# Patient Record
Sex: Male | Born: 1960 | Race: White | Hispanic: No | Marital: Married | Smoking: Never smoker
Health system: Southern US, Community
[De-identification: ages and names within clinical notes are randomized; demographics above are authoritative.]

## PROBLEM LIST (undated history)

## (undated) DIAGNOSIS — F419 Anxiety disorder, unspecified: Secondary | ICD-10-CM

## (undated) DIAGNOSIS — K219 Gastro-esophageal reflux disease without esophagitis: Secondary | ICD-10-CM

## (undated) DIAGNOSIS — N189 Chronic kidney disease, unspecified: Secondary | ICD-10-CM

## (undated) DIAGNOSIS — E785 Hyperlipidemia, unspecified: Secondary | ICD-10-CM

## (undated) DIAGNOSIS — I10 Essential (primary) hypertension: Secondary | ICD-10-CM

## (undated) DIAGNOSIS — T7840XA Allergy, unspecified, initial encounter: Secondary | ICD-10-CM

## (undated) HISTORY — PX: VASECTOMY: SHX75

## (undated) HISTORY — DX: Allergy, unspecified, initial encounter: T78.40XA

## (undated) HISTORY — DX: Essential (primary) hypertension: I10

## (undated) HISTORY — PX: COLONOSCOPY: SHX174

## (undated) HISTORY — PX: WISDOM TOOTH EXTRACTION: SHX21

## (undated) HISTORY — DX: Anxiety disorder, unspecified: F41.9

## (undated) HISTORY — DX: Gastro-esophageal reflux disease without esophagitis: K21.9

## (undated) HISTORY — DX: Hyperlipidemia, unspecified: E78.5

## (undated) HISTORY — DX: Chronic kidney disease, unspecified: N18.9

---

## 2010-06-21 ENCOUNTER — Ambulatory Visit: Payer: Self-pay | Admitting: Gastroenterology

## 2010-06-23 LAB — PATHOLOGY REPORT

## 2010-09-13 ENCOUNTER — Ambulatory Visit: Payer: Self-pay | Admitting: Gastroenterology

## 2013-06-17 ENCOUNTER — Emergency Department: Payer: Self-pay | Admitting: Emergency Medicine

## 2013-06-17 LAB — CK TOTAL AND CKMB (NOT AT ARMC)
CK, Total: 85 U/L (ref 35–232)
CK-MB: 1.4 ng/mL (ref 0.5–3.6)

## 2013-06-17 LAB — COMPREHENSIVE METABOLIC PANEL
Albumin: 3.8 g/dL (ref 3.4–5.0)
Anion Gap: 6 — ABNORMAL LOW (ref 7–16)
Bilirubin,Total: 0.7 mg/dL (ref 0.2–1.0)
Calcium, Total: 9.1 mg/dL (ref 8.5–10.1)
Chloride: 96 mmol/L — ABNORMAL LOW (ref 98–107)
Co2: 32 mmol/L (ref 21–32)
EGFR (African American): >60
EGFR (Non-African Amer.): >60
Osmolality: 270 (ref 275–301)
SGOT(AST): 21 U/L (ref 15–37)
SGPT (ALT): 22 U/L (ref 12–78)
Sodium: 134 mmol/L — ABNORMAL LOW (ref 136–145)
Total Protein: 7.9 g/dL (ref 6.4–8.2)

## 2013-06-17 LAB — CBC
HCT: 49.3 % (ref 40.0–52.0)
HGB: 17.3 g/dL (ref 13.0–18.0)
MCH: 30.5 pg (ref 26.0–34.0)
MCHC: 35 g/dL (ref 32.0–36.0)
Platelet: 261 10*3/uL (ref 150–440)
RDW: 13.4 % (ref 11.5–14.5)

## 2013-06-17 LAB — TROPONIN I
Troponin-I: <0.02 ng/mL
Troponin-I: <0.02 ng/mL

## 2015-03-28 ENCOUNTER — Other Ambulatory Visit: Payer: Self-pay | Admitting: Family Medicine

## 2015-04-09 ENCOUNTER — Ambulatory Visit (INDEPENDENT_AMBULATORY_CARE_PROVIDER_SITE_OTHER): Payer: BC Managed Care – PPO | Admitting: Family Medicine

## 2015-04-09 ENCOUNTER — Encounter: Payer: Self-pay | Admitting: Family Medicine

## 2015-04-09 VITALS — BP 134/78 | HR 62 | Temp 98.6°F | Ht 68.0 in | Wt 211.0 lb

## 2015-04-09 DIAGNOSIS — M1000 Idiopathic gout, unspecified site: Secondary | ICD-10-CM | POA: Diagnosis not present

## 2015-04-09 DIAGNOSIS — R198 Other specified symptoms and signs involving the digestive system and abdomen: Secondary | ICD-10-CM

## 2015-04-09 DIAGNOSIS — K219 Gastro-esophageal reflux disease without esophagitis: Secondary | ICD-10-CM | POA: Insufficient documentation

## 2015-04-09 DIAGNOSIS — I10 Essential (primary) hypertension: Secondary | ICD-10-CM

## 2015-04-09 DIAGNOSIS — M109 Gout, unspecified: Secondary | ICD-10-CM | POA: Insufficient documentation

## 2015-04-09 DIAGNOSIS — R0989 Other specified symptoms and signs involving the circulatory and respiratory systems: Secondary | ICD-10-CM

## 2015-04-09 MED ORDER — COLCHICINE 0.6 MG PO TABS: 0.6000 mg | ORAL_TABLET | Freq: Every day | ORAL | Status: DC

## 2015-04-09 MED ORDER — LOSARTAN POTASSIUM 100 MG PO TABS: 100.0000 mg | ORAL_TABLET | Freq: Every day | ORAL | Status: DC

## 2015-04-09 MED ORDER — ALLOPURINOL 300 MG PO TABS: 300.0000 mg | ORAL_TABLET | Freq: Every day | ORAL | Status: DC

## 2015-04-09 MED ORDER — AMLODIPINE BESYLATE 10 MG PO TABS: 10.0000 mg | ORAL_TABLET | Freq: Every day | ORAL | Status: DC

## 2015-04-09 NOTE — Assessment & Plan Note (Signed)
The current medical regimen is effective;  continue present plan and medications.  

## 2015-04-09 NOTE — Progress Notes (Addendum)
   BP 134/78 mmHg  Pulse 62  Temp(Src) 98.6 F (37 C)  Ht 5\' 8"  (1.727 m)  Wt 211 lb (95.709 kg)  BMI 32.09 kg/m2  SpO2 99%   Subjective:    Patient ID: Alexander Mendoza, male    DOB: 1961/01/01, 54 y.o.   MRN: [id number]  HPI: Alexander Mendoza is a 54 y.o. male  Doing well BP no sx Gout no sx No side effects meds takes meds every day long term Reflux stable Testosterone followed by urology  Also tonsillar area will get sore and drain pus  Relevant past medical, surgical, family and social history reviewed and updated as indicated. Interim medical history since our last visit reviewed. Allergies and medications reviewed and updated.  Review of Systems  Constitutional: Negative.   Respiratory: Negative.   Cardiovascular: Negative.     Per HPI unless specifically indicated above     Objective:    BP 134/78 mmHg  Pulse 62  Temp(Src) 98.6 F (37 C)  Ht 5\' 8"  (1.727 m)  Wt 211 lb (95.709 kg)  BMI 32.09 kg/m2  SpO2 99%  Wt Readings from Last 3 Encounters:  04/09/15 211 lb (95.709 kg)  10/08/14 206 lb (93.441 kg)    Physical Exam  Constitutional: He is oriented to person, place, and time. He appears well-developed and well-nourished. No distress.  HENT:  Head: Normocephalic and atraumatic.  Right Ear: Hearing normal.  Left Ear: Hearing normal.  Nose: Nose normal.  Mouth/Throat: No tonsillar abscesses.  Able to express pus from tonsillar area  Eyes: Conjunctivae and lids are normal. Right eye exhibits no discharge. Left eye exhibits no discharge. No scleral icterus.  Cardiovascular: Normal rate, regular rhythm and normal heart sounds.   Pulmonary/Chest: Effort normal and breath sounds normal. No respiratory distress.  Musculoskeletal: Normal range of motion.  Neurological: He is alert and oriented to person, place, and time.  Skin: Skin is intact. No rash noted.  Psychiatric: He has a normal mood and affect. His speech is normal and behavior is normal. Judgment and  thought content normal. Cognition and memory are normal.        Assessment & Plan:   Problem List Items Addressed This Visit      Cardiovascular and Mediastinum   Hypertension - Primary    The current medical regimen is effective;  continue present plan and medications.       Relevant Medications   amLODipine (NORVASC) 10 MG tablet   losartan (COZAAR) 100 MG tablet   Other Relevant Orders   Basic metabolic panel     Digestive   GERD (gastroesophageal reflux disease)    Followed by GI and stable      Relevant Medications   dexlansoprazole (DEXILANT) 60 MG capsule     Other   Gout    The current medical regimen is effective;  continue present plan and medications.       Relevant Medications   allopurinol (ZYLOPRIM) 300 MG tablet   colchicine 0.6 MG tablet    Other Visit Diagnoses    Tonsil symptom        pus drainage     Relevant Orders    Ambulatory referral to ENT        Follow up plan: Return in about 6 months (around 10/09/2015), or if symptoms worsen or fail to improve, for Physical Exam.

## 2015-04-09 NOTE — Addendum Note (Signed)
Addended byGolden Pop on: 04/09/2015 09:41 AM   Modules accepted: Orders

## 2015-04-09 NOTE — Assessment & Plan Note (Signed)
Followed by GI and stable 

## 2015-04-10 LAB — BASIC METABOLIC PANEL
BUN/Creatinine Ratio: 12 (ref 9–20)
BUN: 13 mg/dL (ref 6–24)
CO2: 24 mmol/L (ref 18–29)
CREATININE: 1.08 mg/dL (ref 0.76–1.27)
Calcium: 9.1 mg/dL (ref 8.7–10.2)
Chloride: 101 mmol/L (ref 97–108)
GFR calc Af Amer: 88 mL/min/{1.73_m2} (ref >59)
GFR calc non Af Amer: 76 mL/min/{1.73_m2} (ref >59)
GLUCOSE: 102 mg/dL — AB (ref 65–99)
POTASSIUM: 4.6 mmol/L (ref 3.5–5.2)
Sodium: 142 mmol/L (ref 134–144)

## 2015-10-13 ENCOUNTER — Encounter: Payer: Self-pay | Admitting: Family Medicine

## 2015-10-13 ENCOUNTER — Ambulatory Visit (INDEPENDENT_AMBULATORY_CARE_PROVIDER_SITE_OTHER): Payer: BC Managed Care – PPO | Admitting: Family Medicine

## 2015-10-13 VITALS — BP 132/84 | HR 61 | Temp 98.9°F | Ht 67.7 in | Wt 215.0 lb

## 2015-10-13 DIAGNOSIS — J019 Acute sinusitis, unspecified: Secondary | ICD-10-CM | POA: Diagnosis not present

## 2015-10-13 DIAGNOSIS — K219 Gastro-esophageal reflux disease without esophagitis: Secondary | ICD-10-CM | POA: Diagnosis not present

## 2015-10-13 DIAGNOSIS — I10 Essential (primary) hypertension: Secondary | ICD-10-CM

## 2015-10-13 DIAGNOSIS — M1000 Idiopathic gout, unspecified site: Secondary | ICD-10-CM

## 2015-10-13 DIAGNOSIS — Z0000 Encounter for general adult medical examination without abnormal findings: Secondary | ICD-10-CM

## 2015-10-13 LAB — URINALYSIS, ROUTINE W REFLEX MICROSCOPIC
BILIRUBIN UA: NEGATIVE
Glucose, UA: NEGATIVE
Ketones, UA: NEGATIVE
Leukocytes, UA: NEGATIVE
Nitrite, UA: NEGATIVE
PH UA: 5 (ref 5.0–7.5)
Protein, UA: NEGATIVE
RBC, UA: NEGATIVE
Specific Gravity, UA: 1.03 (ref 1.005–1.030)
Urobilinogen, Ur: 0.2 mg/dL (ref 0.2–1.0)

## 2015-10-13 MED ORDER — AMOXICILLIN 875 MG PO TABS: 875.0000 mg | ORAL_TABLET | Freq: Two times a day (BID) | ORAL | Status: DC

## 2015-10-13 MED ORDER — ALLOPURINOL 300 MG PO TABS: 300.0000 mg | ORAL_TABLET | Freq: Every day | ORAL | Status: DC

## 2015-10-13 MED ORDER — AZELASTINE HCL 0.1 % NA SOLN: 1.0000 | Freq: Two times a day (BID) | NASAL | Status: DC

## 2015-10-13 MED ORDER — AMLODIPINE BESYLATE 10 MG PO TABS: 10.0000 mg | ORAL_TABLET | Freq: Every day | ORAL | Status: DC

## 2015-10-13 MED ORDER — LOSARTAN POTASSIUM 100 MG PO TABS: 100.0000 mg | ORAL_TABLET | Freq: Every day | ORAL | Status: DC

## 2015-10-13 MED ORDER — FLUTICASONE PROPIONATE 50 MCG/ACT NA SUSP: 2.0000 | Freq: Every day | NASAL | Status: DC

## 2015-10-13 MED ORDER — BENZONATATE 100 MG PO CAPS: 100.0000 mg | ORAL_CAPSULE | Freq: Two times a day (BID) | ORAL | Status: DC | PRN

## 2015-10-13 NOTE — Assessment & Plan Note (Signed)
The current medical regimen is effective;  continue present plan and medications.  

## 2015-10-13 NOTE — Progress Notes (Signed)
BP 132/84 mmHg  Pulse 61  Temp(Src) 98.9 F (37.2 C)  Ht 5' 7.7" (1.72 m)  Wt 215 lb (97.523 kg)  BMI 32.96 kg/m2  SpO2 97%   Subjective:    Patient ID: Alexander Mendoza, male    DOB: 07-28-1961, 55 y.o.   MRN: [id number]  HPI: Alexander Mendoza is a 55 y.o. male  Chief Complaint  Patient presents with  . Annual Exam   Agent for physical but also has had some coughing going on started 4 days ago and is coughing worse now for chills does have head congestion drainage and decreased hearing with ear tightness  No gout symptoms taking allopurinol without problems Getting testosterone replacement therapy through urology Reflux stable  blood pressure stable   Relevant past medical, surgical, family and social history reviewed and updated as indicated. Interim medical history since our last visit reviewed. Allergies and medications reviewed and updated.   Other than noted above Review of Systems  Constitutional: Negative.   HENT: Negative.   Eyes: Negative.   Respiratory: Negative.   Cardiovascular: Negative.   Gastrointestinal: Negative.   Endocrine: Negative.   Genitourinary: Negative.   Musculoskeletal: Negative.   Skin: Negative.   Allergic/Immunologic: Negative.   Neurological: Negative.   Hematological: Negative.   Psychiatric/Behavioral: Negative.     Per HPI unless specifically indicated above     Objective:    BP 132/84 mmHg  Pulse 61  Temp(Src) 98.9 F (37.2 C)  Ht 5' 7.7" (1.72 m)  Wt 215 lb (97.523 kg)  BMI 32.96 kg/m2  SpO2 97%  Wt Readings from Last 3 Encounters:  10/13/15 215 lb (97.523 kg)  04/09/15 211 lb (95.709 kg)  10/08/14 206 lb (93.441 kg)    Physical Exam  Constitutional: He is oriented to person, place, and time. He appears well-developed and well-nourished.  HENT:  Head: Normocephalic and atraumatic.  Right Ear: External ear normal.  Left Ear: External ear normal.  Mouth/Throat: Oropharyngeal exudate present.  Eyes:  Conjunctivae and EOM are normal. Pupils are equal, round, and reactive to light.  Neck: Normal range of motion. Neck supple.  Cardiovascular: Normal rate, regular rhythm, normal heart sounds and intact distal pulses.   Pulmonary/Chest: Effort normal and breath sounds normal.  Abdominal: Soft. Bowel sounds are normal. There is no splenomegaly or hepatomegaly.  Genitourinary:  Done at urology  Musculoskeletal: Normal range of motion.  Neurological: He is alert and oriented to person, place, and time. He has normal reflexes.  Skin: No rash noted. No erythema.  Psychiatric: He has a normal mood and affect. His behavior is normal. Judgment and thought content normal.    Results for orders placed or performed in visit on 123456  Basic metabolic panel  Result Value Ref Range   Glucose 102 (H) 65 - 99 mg/dL   BUN 13 6 - 24 mg/dL   Creatinine, Ser 1.08 0.76 - 1.27 mg/dL   GFR calc non Af Amer 76 >59 mL/min/1.73   GFR calc Af Amer 88 >59 mL/min/1.73   BUN/Creatinine Ratio 12 9 - 20   Sodium 142 134 - 144 mmol/L   Potassium 4.6 3.5 - 5.2 mmol/L   Chloride 101 97 - 108 mmol/L   CO2 24 18 - 29 mmol/L   Calcium 9.1 8.7 - 10.2 mg/dL      Assessment & Plan:   Problem List Items Addressed This Visit      Cardiovascular and Mediastinum   Hypertension    The  current medical regimen is effective;  continue present plan and medications.       Relevant Medications   amLODipine (NORVASC) 10 MG tablet   losartan (COZAAR) 100 MG tablet     Respiratory   Sinusitis, acute    Discuss sinusitis care and treatment Tylenol Mucinex Mucinex D nasal rinse Discuss use of Amoxil Tessalon Perles for cough        Relevant Medications   fluticasone (FLONASE) 50 MCG/ACT nasal spray   azelastine (ASTELIN) 0.1 % nasal spray   amoxicillin (AMOXIL) 875 MG tablet   benzonatate (TESSALON) 100 MG capsule     Digestive   GERD (gastroesophageal reflux disease)    The current medical regimen is  effective;  continue present plan and medications.         Other   Gout    The current medical regimen is effective;  continue present plan and medications.       Relevant Medications   allopurinol (ZYLOPRIM) 300 MG tablet   Other Relevant Orders   Uric acid    Other Visit Diagnoses    Routine general medical examination at a health care facility    -  Primary    Relevant Orders    CBC with Differential/Platelet    Comprehensive metabolic panel    Lipid Panel w/o Chol/HDL Ratio    PSA    TSH    Urinalysis, Routine w reflex microscopic (not at [facility])        Follow up plan: Return in about 6 months (around 04/11/2016) for BMP.

## 2015-10-13 NOTE — Assessment & Plan Note (Signed)
Discuss sinusitis care and treatment Tylenol Mucinex Mucinex D nasal rinse Discuss use of Amoxil Tessalon Perles for cough

## 2015-10-14 ENCOUNTER — Encounter: Payer: Self-pay | Admitting: Family Medicine

## 2015-10-14 LAB — COMPREHENSIVE METABOLIC PANEL
ALBUMIN: 4 g/dL (ref 3.5–5.5)
ALT: 38 IU/L (ref 0–44)
AST: 36 IU/L (ref 0–40)
Albumin/Globulin Ratio: 1.6 (ref 1.1–2.5)
Alkaline Phosphatase: 97 IU/L (ref 39–117)
BILIRUBIN TOTAL: 0.4 mg/dL (ref 0.0–1.2)
BUN/Creatinine Ratio: 10 (ref 9–20)
BUN: 11 mg/dL (ref 6–24)
CO2: 24 mmol/L (ref 18–29)
CREATININE: 1.15 mg/dL (ref 0.76–1.27)
Calcium: 8.5 mg/dL — ABNORMAL LOW (ref 8.7–10.2)
Chloride: 100 mmol/L (ref 96–106)
GFR calc Af Amer: 81 mL/min/{1.73_m2} (ref >59)
GFR calc non Af Amer: 70 mL/min/{1.73_m2} (ref >59)
GLOBULIN, TOTAL: 2.5 g/dL (ref 1.5–4.5)
GLUCOSE: 86 mg/dL (ref 65–99)
Potassium: 4.2 mmol/L (ref 3.5–5.2)
Sodium: 141 mmol/L (ref 134–144)
Total Protein: 6.5 g/dL (ref 6.0–8.5)

## 2015-10-14 LAB — CBC WITH DIFFERENTIAL/PLATELET
BASOS ABS: 0 10*3/uL (ref 0.0–0.2)
Basos: 0 %
EOS (ABSOLUTE): 0.4 10*3/uL (ref 0.0–0.4)
Eos: 5 %
Hematocrit: 44.2 % (ref 37.5–51.0)
Hemoglobin: 15.5 g/dL (ref 12.6–17.7)
Immature Grans (Abs): 0 10*3/uL (ref 0.0–0.1)
Immature Granulocytes: 0 %
LYMPHS ABS: 1.8 10*3/uL (ref 0.7–3.1)
Lymphs: 19 %
MCH: 30.2 pg (ref 26.6–33.0)
MCHC: 35.1 g/dL (ref 31.5–35.7)
MCV: 86 fL (ref 79–97)
Monocytes Absolute: 1 10*3/uL — ABNORMAL HIGH (ref 0.1–0.9)
Monocytes: 10 %
NEUTROS ABS: 6.4 10*3/uL (ref 1.4–7.0)
Neutrophils: 66 %
Platelets: 282 10*3/uL (ref 150–379)
RBC: 5.14 x10E6/uL (ref 4.14–5.80)
RDW: 14 % (ref 12.3–15.4)
WBC: 9.8 10*3/uL (ref 3.4–10.8)

## 2015-10-14 LAB — LIPID PANEL W/O CHOL/HDL RATIO
CHOLESTEROL TOTAL: 206 mg/dL — AB (ref 100–199)
HDL: 53 mg/dL (ref >39)
LDL Calculated: 110 mg/dL — ABNORMAL HIGH (ref 0–99)
TRIGLYCERIDES: 216 mg/dL — AB (ref 0–149)
VLDL Cholesterol Cal: 43 mg/dL — ABNORMAL HIGH (ref 5–40)

## 2015-10-14 LAB — TSH: TSH: 2.87 u[IU]/mL (ref 0.450–4.500)

## 2015-10-14 LAB — PSA: PROSTATE SPECIFIC AG, SERUM: 3.8 ng/mL (ref 0.0–4.0)

## 2015-10-15 ENCOUNTER — Telehealth: Payer: Self-pay | Admitting: Family Medicine

## 2015-10-15 NOTE — Telephone Encounter (Signed)
Phone call Discussed with patient labs elevated PSA patient has appointment with Dr. Eliberto Ivory in March for follow-up with PSA with urology.

## 2015-10-15 NOTE — Telephone Encounter (Signed)
Pt called stated he is returning Nancy's call, pt stated he will only have his phone on him for the next 20 minutes please call back ASAP. Pt stated it is ok to leave detailed message on cell #. Thanks.

## 2015-10-15 NOTE — Telephone Encounter (Signed)
-----   Message from Wynn Maudlin, Pisek sent at 10/15/2015 12:05 PM EST ----- labs

## 2015-11-19 ENCOUNTER — Encounter: Payer: Self-pay | Admitting: Family Medicine

## 2015-11-19 ENCOUNTER — Ambulatory Visit (INDEPENDENT_AMBULATORY_CARE_PROVIDER_SITE_OTHER): Payer: BC Managed Care – PPO | Admitting: Family Medicine

## 2015-11-19 VITALS — BP 127/84 | HR 84 | Temp 98.7°F | Ht 67.2 in | Wt 215.0 lb

## 2015-11-19 DIAGNOSIS — R52 Pain, unspecified: Secondary | ICD-10-CM

## 2015-11-19 DIAGNOSIS — J101 Influenza due to other identified influenza virus with other respiratory manifestations: Secondary | ICD-10-CM | POA: Diagnosis not present

## 2015-11-19 DIAGNOSIS — R509 Fever, unspecified: Secondary | ICD-10-CM

## 2015-11-19 LAB — INFLUENZA A AND B
INFLUENZA B AG, EIA: POSITIVE — AB
Influenza A Ag, EIA: NEGATIVE

## 2015-11-19 MED ORDER — BENZONATATE 200 MG PO CAPS: 200.0000 mg | ORAL_CAPSULE | Freq: Three times a day (TID) | ORAL | Status: DC | PRN

## 2015-11-19 MED ORDER — HYDROCOD POLST-CPM POLST ER 10-8 MG/5ML PO SUER: 5.0000 mL | Freq: Every evening | ORAL | Status: DC | PRN

## 2015-11-19 NOTE — Progress Notes (Signed)
BP 127/84 mmHg  Pulse 84  Temp(Src) 98.7 F (37.1 C)  Ht 5' 7.2" (1.707 m)  Wt 215 lb (97.523 kg)  BMI 33.47 kg/m2  SpO2 95%   Subjective:    Patient ID: Alexander Mendoza, male    DOB: December 26, 1960, 55 y.o.   MRN: [id number]  HPI: Alexander Mendoza is a 55 y.o. male  Chief Complaint  Patient presents with  . URI    X 4 days   UPPER RESPIRATORY TRACT INFECTION Duration: 4 days Worst symptom: Chills body aches Fever: yes Cough: yes Shortness of breath: yes Wheezing: yes Chest pain: no Chest tightness: no Chest congestion: no Nasal congestion: no Runny nose: yes Post nasal drip: yes Sneezing: no Sore throat: yes Swollen glands: no Sinus pressure: no Headache: yes Face pain: yes Toothache: yes Ear pain: yes bilateral Ear pressure:  yes Eyes red/itching:no Eye drainage/crusting: no  Vomiting: no Rash: no Fatigue: yes Sick contacts: yes Strep contacts: yes  Context: stable Recurrent sinusitis: no Relief with OTC cold/cough medications: no  Treatments attempted: cold/sinus, mucinex, anti-histamine and pseudoephedrine    Relevant past medical, surgical, family and social history reviewed and updated as indicated. Interim medical history since our last visit reviewed. Allergies and medications reviewed and updated.  Review of Systems  Constitutional: Negative.   HENT: Negative.   Respiratory: Negative.   Cardiovascular: Negative.   Gastrointestinal: Negative.   Psychiatric/Behavioral: Negative.     Per HPI unless specifically indicated above     Objective:    BP 127/84 mmHg  Pulse 84  Temp(Src) 98.7 F (37.1 C)  Ht 5' 7.2" (1.707 m)  Wt 215 lb (97.523 kg)  BMI 33.47 kg/m2  SpO2 95%  Wt Readings from Last 3 Encounters:  11/19/15 215 lb (97.523 kg)  10/13/15 215 lb (97.523 kg)  04/09/15 211 lb (95.709 kg)    Physical Exam  Constitutional: He is oriented to person, place, and time. He appears well-developed and well-nourished. No distress.  HENT:   Head: Normocephalic and atraumatic.  Right Ear: Hearing and external ear normal.  Left Ear: Hearing and external ear normal.  Nose: Nose normal.  Mouth/Throat: Oropharynx is clear and moist. No oropharyngeal exudate.  Eyes: Conjunctivae, EOM and lids are normal. Pupils are equal, round, and reactive to light. Right eye exhibits no discharge. Left eye exhibits no discharge. No scleral icterus.  Neck: Normal range of motion. Neck supple.  Cardiovascular: Normal rate, regular rhythm, normal heart sounds and intact distal pulses.  Exam reveals no gallop and no friction rub.   No murmur heard. Pulmonary/Chest: Effort normal and breath sounds normal. No respiratory distress. He has no wheezes. He has no rales. He exhibits no tenderness.  Musculoskeletal: Normal range of motion.  Neurological: He is alert and oriented to person, place, and time.  Skin: Skin is warm, dry and intact. No rash noted. No erythema. No pallor.  Psychiatric: He has a normal mood and affect. His speech is normal and behavior is normal. Judgment and thought content normal. Cognition and memory are normal.  Nursing note and vitals reviewed.   Results for orders placed or performed in visit on 10/13/15  CBC with Differential/Platelet  Result Value Ref Range   WBC 9.8 3.4 - 10.8 x10E3/uL   RBC 5.14 4.14 - 5.80 x10E6/uL   Hemoglobin 15.5 12.6 - 17.7 g/dL   Hematocrit 44.2 37.5 - 51.0 %   MCV 86 79 - 97 fL   MCH 30.2 26.6 - 33.0 pg  MCHC 35.1 31.5 - 35.7 g/dL   RDW 14.0 12.3 - 15.4 %   Platelets 282 150 - 379 x10E3/uL   Neutrophils 66 %   Lymphs 19 %   Monocytes 10 %   Eos 5 %   Basos 0 %   Neutrophils Absolute 6.4 1.4 - 7.0 x10E3/uL   Lymphocytes Absolute 1.8 0.7 - 3.1 x10E3/uL   Monocytes Absolute 1.0 (H) 0.1 - 0.9 x10E3/uL   EOS (ABSOLUTE) 0.4 0.0 - 0.4 x10E3/uL   Basophils Absolute 0.0 0.0 - 0.2 x10E3/uL   Immature Granulocytes 0 %   Immature Grans (Abs) 0.0 0.0 - 0.1 x10E3/uL  Comprehensive metabolic panel   Result Value Ref Range   Glucose 86 65 - 99 mg/dL   BUN 11 6 - 24 mg/dL   Creatinine, Ser 1.15 0.76 - 1.27 mg/dL   GFR calc non Af Amer 70 >59 mL/min/1.73   GFR calc Af Amer 81 >59 mL/min/1.73   BUN/Creatinine Ratio 10 9 - 20   Sodium 141 134 - 144 mmol/L   Potassium 4.2 3.5 - 5.2 mmol/L   Chloride 100 96 - 106 mmol/L   CO2 24 18 - 29 mmol/L   Calcium 8.5 (L) 8.7 - 10.2 mg/dL   Total Protein 6.5 6.0 - 8.5 g/dL   Albumin 4.0 3.5 - 5.5 g/dL   Globulin, Total 2.5 1.5 - 4.5 g/dL   Albumin/Globulin Ratio 1.6 1.1 - 2.5   Bilirubin Total 0.4 0.0 - 1.2 mg/dL   Alkaline Phosphatase 97 39 - 117 IU/L   AST 36 0 - 40 IU/L   ALT 38 0 - 44 IU/L  Lipid Panel w/o Chol/HDL Ratio  Result Value Ref Range   Cholesterol, Total 206 (H) 100 - 199 mg/dL   Triglycerides 216 (H) 0 - 149 mg/dL   HDL 53 >39 mg/dL   VLDL Cholesterol Cal 43 (H) 5 - 40 mg/dL   LDL Calculated 110 (H) 0 - 99 mg/dL  PSA  Result Value Ref Range   Prostate Specific Ag, Serum 3.8 0.0 - 4.0 ng/mL  TSH  Result Value Ref Range   TSH 2.870 0.450 - 4.500 uIU/mL  Urinalysis, Routine w reflex microscopic (not at [facility])  Result Value Ref Range   Specific Gravity, UA 1.030 1.005 - 1.030   pH, UA 5.0 5.0 - 7.5   Color, UA Yellow Yellow   Appearance Ur Clear Clear   Leukocytes, UA Negative Negative   Protein, UA Negative Negative/Trace   Glucose, UA Negative Negative   Ketones, UA Negative Negative   RBC, UA Negative Negative   Bilirubin, UA Negative Negative   Urobilinogen, Ur 0.2 0.2 - 1.0 mg/dL   Nitrite, UA Negative Negative      Assessment & Plan:   Problem List Items Addressed This Visit    None    Visit Diagnoses    Influenza B    -  Primary    Too late for tamiflu. Will treat symptoms with tussionex and tessalon perles. Continue to monitor.     Body aches        Will swab for flu. Await results.     Relevant Orders    Influenza a and b    Other specified fever        Will swab for flu. Await results.      Relevant Orders    Influenza a and b        Follow up plan: Return if symptoms worsen or fail to  improve.

## 2016-05-09 ENCOUNTER — Ambulatory Visit (INDEPENDENT_AMBULATORY_CARE_PROVIDER_SITE_OTHER): Payer: BC Managed Care – PPO | Admitting: Family Medicine

## 2016-05-09 ENCOUNTER — Encounter: Payer: Self-pay | Admitting: Family Medicine

## 2016-05-09 VITALS — BP 139/82 | HR 57 | Temp 98.0°F | Ht 68.3 in | Wt 215.0 lb

## 2016-05-09 DIAGNOSIS — I10 Essential (primary) hypertension: Secondary | ICD-10-CM

## 2016-05-09 NOTE — Progress Notes (Signed)
   BP 139/82 (BP Location: Left Arm, Patient Position: Sitting, Cuff Size: Normal)   Pulse (!) 57   Temp 98 F (36.7 C)   Ht 5' 8.3" (1.735 m)   Wt 215 lb (97.5 kg)   SpO2 99%   BMI 32.40 kg/m    Subjective:    Patient ID: Alexander Mendoza, male    DOB: [birth year], 55 y.o.   MRN: [id number]  HPI: Alexander Mendoza is a 55 y.o. male  Chief Complaint  Patient presents with  . Hypertension  Patient doing well no complaints taking blood pressure medicines without problems or issues good control checks occasionally from time to time with good control. Reflux well controlled on Dexalant which is only thing that seems to work. No episodes of gout doing well no complaints from medications  Relevant past medical, surgical, family and social history reviewed and updated as indicated. Interim medical history since our last visit reviewed. Allergies and medications reviewed and updated.  Review of Systems  Constitutional: Negative.   Respiratory: Negative.   Cardiovascular: Negative.     Per HPI unless specifically indicated above     Objective:    BP 139/82 (BP Location: Left Arm, Patient Position: Sitting, Cuff Size: Normal)   Pulse (!) 57   Temp 98 F (36.7 C)   Ht 5' 8.3" (1.735 m)   Wt 215 lb (97.5 kg)   SpO2 99%   BMI 32.40 kg/m   Wt Readings from Last 3 Encounters:  05/09/16 215 lb (97.5 kg)  11/19/15 215 lb (97.5 kg)  10/13/15 215 lb (97.5 kg)    Physical Exam  Constitutional: He is oriented to person, place, and time. He appears well-developed and well-nourished. No distress.  HENT:  Head: Normocephalic and atraumatic.  Right Ear: Hearing normal.  Left Ear: Hearing normal.  Nose: Nose normal.  Eyes: Conjunctivae and lids are normal. Right eye exhibits no discharge. Left eye exhibits no discharge. No scleral icterus.  Cardiovascular: Normal rate, regular rhythm and normal heart sounds.   Pulmonary/Chest: Effort normal and breath sounds normal. No respiratory  distress.  Musculoskeletal: Normal range of motion.  Neurological: He is alert and oriented to person, place, and time.  Skin: Skin is intact. No rash noted.  Psychiatric: He has a normal mood and affect. His speech is normal and behavior is normal. Judgment and thought content normal. Cognition and memory are normal.    Results for orders placed or performed in visit on 11/19/15  Influenza a and b  Result Value Ref Range   Influenza A Ag, EIA Negative Negative   Influenza B Ag, EIA Positive (A) Negative   Influenza Comment None       Assessment & Plan:   Problem List Items Addressed This Visit      Cardiovascular and Mediastinum   Hypertension - Primary   Relevant Medications   Multiple Vitamin (MULTI-VITAMINS) TABS   Other Relevant Orders   Basic metabolic panel    Other Visit Diagnoses   None.      Follow up plan: Return in about 6 months (around 11/09/2016) for  Physical exam labs and uric acid.

## 2016-05-10 ENCOUNTER — Encounter: Payer: Self-pay | Admitting: Family Medicine

## 2016-05-10 LAB — BASIC METABOLIC PANEL
BUN / CREAT RATIO: 10 (ref 9–20)
BUN: 12 mg/dL (ref 6–24)
CO2: 28 mmol/L (ref 18–29)
Calcium: 9 mg/dL (ref 8.7–10.2)
Chloride: 99 mmol/L (ref 96–106)
Creatinine, Ser: 1.25 mg/dL (ref 0.76–1.27)
GFR calc Af Amer: 73 mL/min/{1.73_m2} (ref >59)
GFR, EST NON AFRICAN AMERICAN: 64 mL/min/{1.73_m2} (ref >59)
GLUCOSE: 101 mg/dL — AB (ref 65–99)
POTASSIUM: 4 mmol/L (ref 3.5–5.2)
SODIUM: 141 mmol/L (ref 134–144)

## 2016-08-19 ENCOUNTER — Encounter: Payer: Self-pay | Admitting: Family Medicine

## 2016-08-19 ENCOUNTER — Ambulatory Visit (INDEPENDENT_AMBULATORY_CARE_PROVIDER_SITE_OTHER): Payer: BC Managed Care – PPO | Admitting: Family Medicine

## 2016-08-19 VITALS — BP 136/87 | HR 73 | Temp 98.5°F | Wt 220.0 lb

## 2016-08-19 DIAGNOSIS — J069 Acute upper respiratory infection, unspecified: Secondary | ICD-10-CM | POA: Diagnosis not present

## 2016-08-19 MED ORDER — AMOXICILLIN-POT CLAVULANATE 875-125 MG PO TABS: 1.0000 | ORAL_TABLET | Freq: Two times a day (BID) | ORAL | 0 refills | Status: DC

## 2016-08-19 MED ORDER — BENZONATATE 100 MG PO CAPS: 200.0000 mg | ORAL_CAPSULE | Freq: Three times a day (TID) | ORAL | 0 refills | Status: DC | PRN

## 2016-08-19 NOTE — Patient Instructions (Signed)
Follow up as needed

## 2016-08-19 NOTE — Progress Notes (Signed)
   BP 136/87   Pulse 73   Temp 98.5 F (36.9 C)   Wt 220 lb (99.8 kg)   SpO2 98%   BMI 33.16 kg/m    Subjective:    Patient ID: Alexander Mendoza, male    DOB: 06-26-1961, 55 y.o.   MRN: [id number]  HPI: Alexander Mendoza is a 55 y.o. male  Chief Complaint  Patient presents with  . URI    x 1 week. head and chest congestion, productive cough, ears clogged. No fever, no sore throat.    Patient presents with 1 week history of congestion, productive cough, ear pain, and malaise. Denies fever, chills, N/V/D. Taking Mucinex with minimal relief. Wife has been sick recently as well.   Relevant past medical, surgical, family and social history reviewed and updated as indicated. Interim medical history since our last visit reviewed. Allergies and medications reviewed and updated.  Review of Systems  Constitutional: Positive for fatigue.  HENT: Positive for congestion, ear pain, postnasal drip, rhinorrhea and sinus pressure.   Eyes: Positive for discharge.  Respiratory: Positive for cough.   Cardiovascular: Negative.   Gastrointestinal: Negative.   Genitourinary: Negative.   Musculoskeletal: Negative.   Skin: Negative.   Neurological: Negative.   Psychiatric/Behavioral: Negative.     Per HPI unless specifically indicated above     Objective:    BP 136/87   Pulse 73   Temp 98.5 F (36.9 C)   Wt 220 lb (99.8 kg)   SpO2 98%   BMI 33.16 kg/m   Wt Readings from Last 3 Encounters:  08/19/16 220 lb (99.8 kg)  05/09/16 215 lb (97.5 kg)  11/19/15 215 lb (97.5 kg)    Physical Exam  Constitutional: He is oriented to person, place, and time. He appears well-developed and well-nourished.  HENT:  Head: Atraumatic.  Nose: Nose normal.  Left TM mildly injected Oropharynx erythematous  Eyes: Conjunctivae are normal. Pupils are equal, round, and reactive to light. No scleral icterus.  Neck: Normal range of motion. Neck supple.  Cardiovascular: Normal rate.   Pulmonary/Chest: Effort  normal and breath sounds normal. No respiratory distress. He has no wheezes. He has no rales.  Musculoskeletal: Normal range of motion. He exhibits no tenderness.  Neurological: He is alert and oriented to person, place, and time.  Skin: Skin is warm and dry. No rash noted.  Psychiatric: He has a normal mood and affect. His behavior is normal.  Nursing note and vitals reviewed.     Assessment & Plan:   Problem List Items Addressed This Visit    None    Visit Diagnoses    Upper respiratory tract infection, unspecified type    -  Primary   Augmentin and tessalon perles sent. Continue mucinex, can try delsym or nyquil for nighttime cough relief. Rest, good hydration.        Follow up plan: Return if symptoms worsen or fail to improve.

## 2016-10-13 ENCOUNTER — Ambulatory Visit (INDEPENDENT_AMBULATORY_CARE_PROVIDER_SITE_OTHER): Payer: BC Managed Care – PPO | Admitting: Family Medicine

## 2016-10-13 ENCOUNTER — Other Ambulatory Visit: Payer: Self-pay

## 2016-10-13 ENCOUNTER — Encounter: Payer: Self-pay | Admitting: Family Medicine

## 2016-10-13 VITALS — BP 129/83 | HR 75 | Temp 98.8°F | Wt 222.0 lb

## 2016-10-13 DIAGNOSIS — B029 Zoster without complications: Secondary | ICD-10-CM

## 2016-10-13 MED ORDER — VALACYCLOVIR HCL 1 G PO TABS: 1000.0000 mg | ORAL_TABLET | Freq: Three times a day (TID) | ORAL | 0 refills | Status: DC

## 2016-10-13 MED ORDER — PREDNISONE 20 MG PO TABS: 40.0000 mg | ORAL_TABLET | Freq: Every day | ORAL | 0 refills | Status: DC

## 2016-10-13 NOTE — Patient Instructions (Signed)
Follow up as needed

## 2016-10-13 NOTE — Progress Notes (Signed)
   BP 129/83   Pulse 75   Temp 98.8 F (37.1 C)   Wt 222 lb (100.7 kg)   SpO2 97%   BMI 33.46 kg/m    Subjective:    Patient ID: Alexander Mendoza, male    DOB: [birth year], 56 y.o.   MRN: [id number]  HPI: Alexander Mendoza is a 56 y.o. male  Chief Complaint  Patient presents with  . Rash    x 4 days. small rash on his upper right arm , burning on right side of his chest/abdomen.   4 day history of tingling, burning area on right chest wrapping around side. Some red areas have come up but not many. Very painful to the touch. Denies hx of shingles or vaccine. No new products or foods. Has not tried anything OTC.   Relevant past medical, surgical, family and social history reviewed and updated as indicated. Interim medical history since our last visit reviewed. Allergies and medications reviewed and updated.  Review of Systems  Constitutional: Negative.   HENT: Negative.   Eyes: Negative.   Respiratory: Positive for choking.   Cardiovascular: Negative.   Gastrointestinal: Negative.   Genitourinary: Negative.   Musculoskeletal: Negative.   Skin: Negative.   Neurological: Negative.   Psychiatric/Behavioral: Negative.     Per HPI unless specifically indicated above     Objective:    BP 129/83   Pulse 75   Temp 98.8 F (37.1 C)   Wt 222 lb (100.7 kg)   SpO2 97%   BMI 33.46 kg/m   Wt Readings from Last 3 Encounters:  10/13/16 222 lb (100.7 kg)  08/19/16 220 lb (99.8 kg)  05/09/16 215 lb (97.5 kg)    Physical Exam  Constitutional: He is oriented to person, place, and time. He appears well-developed and well-nourished. No distress.  HENT:  Head: Atraumatic.  Eyes: Conjunctivae are normal. Pupils are equal, round, and reactive to light.  Neck: Normal range of motion. Neck supple.  Cardiovascular: Normal rate and normal heart sounds.   Pulmonary/Chest: Effort normal and breath sounds normal. No respiratory distress.  Musculoskeletal: Normal range of motion.    Neurological: He is alert and oriented to person, place, and time.  Skin: Skin is warm and dry. Rash (Clustered erythematous maculopapular rash on right upper side, very slight) noted.  Psychiatric: He has a normal mood and affect. His behavior is normal.  Nursing note and vitals reviewed.     Assessment & Plan:   Problem List Items Addressed This Visit    None    Visit Diagnoses    Herpes zoster without complication    -  Primary   Will treat with valtrex and prednisone burst. Discussed keeping covered, good hand hygeine, reducing contact wiht immune compromised people   Relevant Medications   valACYclovir (VALTREX) 1000 MG tablet       Follow up plan: Return if symptoms worsen or fail to improve.

## 2016-10-17 ENCOUNTER — Telehealth: Payer: Self-pay | Admitting: Family Medicine

## 2016-10-17 NOTE — Telephone Encounter (Signed)
Patient was seen. 

## 2016-10-18 NOTE — Telephone Encounter (Signed)
We don't call in antibiotics without being seen. Sorry.

## 2016-10-18 NOTE — Telephone Encounter (Signed)
Patient was seen by Alexander Mendoza on 1/4 for possible shingles but since he was seen here he has developed a  possible sinus infection.  He is hoping to avoid coming into the office since he was just seen here on the 4th.  He is hoping someone will call him in something for this.  Please advise. Thank you [name]  He said if we cannot reach him we can leave a message.

## 2016-10-18 NOTE — Telephone Encounter (Signed)
Routing to provider  

## 2016-10-18 NOTE — Telephone Encounter (Signed)
Called and left patient a VM (it was personalized) letting him know what Dr. Wynetta Emery said. I asked for the patient to give [place] a call to schedule an appointment.

## 2016-10-19 ENCOUNTER — Ambulatory Visit (INDEPENDENT_AMBULATORY_CARE_PROVIDER_SITE_OTHER): Payer: BC Managed Care – PPO | Admitting: Family Medicine

## 2016-10-19 ENCOUNTER — Other Ambulatory Visit: Payer: Self-pay | Admitting: Family Medicine

## 2016-10-19 ENCOUNTER — Encounter: Payer: Self-pay | Admitting: Family Medicine

## 2016-10-19 VITALS — BP 146/88 | HR 70 | Temp 98.4°F | Wt 220.0 lb

## 2016-10-19 DIAGNOSIS — I10 Essential (primary) hypertension: Secondary | ICD-10-CM

## 2016-10-19 DIAGNOSIS — J101 Influenza due to other identified influenza virus with other respiratory manifestations: Secondary | ICD-10-CM | POA: Diagnosis not present

## 2016-10-19 LAB — VERITOR FLU A/B WAIVED
INFLUENZA B: NEGATIVE
Influenza A: POSITIVE — AB

## 2016-10-19 MED ORDER — HYDROCOD POLST-CPM POLST ER 10-8 MG/5ML PO SUER: 5.0000 mL | Freq: Two times a day (BID) | ORAL | 0 refills | Status: DC | PRN

## 2016-10-19 NOTE — Telephone Encounter (Signed)
Routing to provider. Appt 11/15/16.

## 2016-10-19 NOTE — Progress Notes (Signed)
BP (!) 146/88   Pulse 70   Temp 98.4 F (36.9 C)   Wt 220 lb (99.8 kg)   SpO2 97%   BMI 33.16 kg/m    Subjective:    Patient ID: Alexander Mendoza, male    DOB: 1961/01/30, 56 y.o.   MRN: [id number]  HPI: Alexander Mendoza is a 56 y.o. male  Chief Complaint  Patient presents with  . URI    x 4 days. Head and chest congestion, productive cough, sinus drainage, ears clogged, pressure in face, headache. Fever just 1 night, no body aches.   Patient presents with 4 day history of congestion, productive cough that is worst at night, sinus drainage and pressure, ear pressure, HA, intermittent fever. Taking mucinex, nuqyuil, dayquil, and sinus pills with some relief. Denies body aches, SOB, chills, N/V/D. No sick contacts that he is aware of. UTD on flu vaccine.  Past Medical History:  Diagnosis Date  . Allergy   . Chronic kidney disease   . GERD (gastroesophageal reflux disease)   . Hyperlipidemia   . Hypertension    Social History   Social History  . Marital status: Married    Spouse name: N/A  . Number of children: N/A  . Years of education: N/A   Occupational History  . Not on file.   Social History Main Topics  . Smoking status: Never Smoker  . Smokeless tobacco: Current User    Types: Snuff  . Alcohol use Yes  . Drug use: No  . Sexual activity: Not on file   Other Topics Concern  . Not on file   Social History Narrative  . No narrative on file    Relevant past medical, surgical, family and social history reviewed and updated as indicated. Interim medical history since our last visit reviewed. Allergies and medications reviewed and updated.  Review of Systems  Constitutional: Positive for fever.  HENT: Positive for congestion, ear pain and sinus pressure.   Eyes: Negative.   Respiratory: Positive for cough.   Cardiovascular: Negative.   Gastrointestinal: Negative.   Genitourinary: Negative.   Musculoskeletal: Negative.   Psychiatric/Behavioral: Negative.       Per HPI unless specifically indicated above     Objective:    BP (!) 146/88   Pulse 70   Temp 98.4 F (36.9 C)   Wt 220 lb (99.8 kg)   SpO2 97%   BMI 33.16 kg/m   Wt Readings from Last 3 Encounters:  10/19/16 220 lb (99.8 kg)  10/13/16 222 lb (100.7 kg)  08/19/16 220 lb (99.8 kg)    Physical Exam  Constitutional: He is oriented to person, place, and time. He appears well-developed and well-nourished. No distress.  HENT:  Head: Atraumatic.  Nose: Nose normal.  Mouth/Throat: Oropharynx is clear and moist. No oropharyngeal exudate.  Right TM injected, but not edematous  Eyes: Conjunctivae are normal. Pupils are equal, round, and reactive to light.  Neck: Normal range of motion. Neck supple.  Cardiovascular: Normal rate and normal heart sounds.   Pulmonary/Chest: Effort normal. No respiratory distress.  Musculoskeletal: Normal range of motion.  Lymphadenopathy:    He has no cervical adenopathy.  Neurological: He is alert and oriented to person, place, and time.  Skin: Skin is warm and dry.  Psychiatric: He has a normal mood and affect. His behavior is normal.  Nursing note and vitals reviewed.     Assessment & Plan:   Problem List Items Addressed This Visit  None    Visit Diagnoses    Influenza A    -  Primary   Out of tamiflu window. Tussionex sent for comfort. Rest, good hydration, tylenol prn. Follow up if worsening or no improvement   Relevant Orders   Veritor Flu A/B Waived    Discussed sedation precautions with tamiflu. Patient understanding and agreeable to plan.    Follow up plan: Return if symptoms worsen or fail to improve.

## 2016-10-19 NOTE — Patient Instructions (Signed)
Follow up as needed

## 2016-10-21 ENCOUNTER — Other Ambulatory Visit: Payer: Self-pay | Admitting: Family Medicine

## 2016-10-21 DIAGNOSIS — M1000 Idiopathic gout, unspecified site: Secondary | ICD-10-CM

## 2016-10-30 ENCOUNTER — Other Ambulatory Visit: Payer: Self-pay | Admitting: Family Medicine

## 2016-10-30 DIAGNOSIS — M1000 Idiopathic gout, unspecified site: Secondary | ICD-10-CM

## 2016-10-31 ENCOUNTER — Other Ambulatory Visit: Payer: Self-pay | Admitting: Family Medicine

## 2016-10-31 DIAGNOSIS — I10 Essential (primary) hypertension: Secondary | ICD-10-CM

## 2016-10-31 NOTE — Telephone Encounter (Signed)
Last (acute) OV: 10/19/16 Last routine OV: 05/09/16 Next OV: 11/15/16

## 2016-11-15 ENCOUNTER — Ambulatory Visit (INDEPENDENT_AMBULATORY_CARE_PROVIDER_SITE_OTHER): Payer: BC Managed Care – PPO | Admitting: Family Medicine

## 2016-11-15 ENCOUNTER — Encounter: Payer: Self-pay | Admitting: Family Medicine

## 2016-11-15 VITALS — BP 136/87 | HR 67 | Temp 98.4°F | Ht 68.5 in | Wt 215.0 lb

## 2016-11-15 DIAGNOSIS — Z125 Encounter for screening for malignant neoplasm of prostate: Secondary | ICD-10-CM | POA: Diagnosis not present

## 2016-11-15 DIAGNOSIS — I10 Essential (primary) hypertension: Secondary | ICD-10-CM

## 2016-11-15 DIAGNOSIS — Z1329 Encounter for screening for other suspected endocrine disorder: Secondary | ICD-10-CM | POA: Diagnosis not present

## 2016-11-15 DIAGNOSIS — K219 Gastro-esophageal reflux disease without esophagitis: Secondary | ICD-10-CM

## 2016-11-15 DIAGNOSIS — Z1159 Encounter for screening for other viral diseases: Secondary | ICD-10-CM | POA: Diagnosis not present

## 2016-11-15 DIAGNOSIS — Z114 Encounter for screening for human immunodeficiency virus [HIV]: Secondary | ICD-10-CM | POA: Diagnosis not present

## 2016-11-15 DIAGNOSIS — M1000 Idiopathic gout, unspecified site: Secondary | ICD-10-CM | POA: Diagnosis not present

## 2016-11-15 DIAGNOSIS — Z0000 Encounter for general adult medical examination without abnormal findings: Secondary | ICD-10-CM

## 2016-11-15 DIAGNOSIS — Z13220 Encounter for screening for lipoid disorders: Secondary | ICD-10-CM | POA: Diagnosis not present

## 2016-11-15 LAB — URINALYSIS, ROUTINE W REFLEX MICROSCOPIC
Bilirubin, UA: NEGATIVE
GLUCOSE, UA: NEGATIVE
KETONES UA: NEGATIVE
Leukocytes, UA: NEGATIVE
NITRITE UA: NEGATIVE
RBC, UA: NEGATIVE
Specific Gravity, UA: 1.02 (ref 1.005–1.030)
UUROB: 0.2 mg/dL (ref 0.2–1.0)
pH, UA: 6.5 (ref 5.0–7.5)

## 2016-11-15 LAB — MICROSCOPIC EXAMINATION: RBC MICROSCOPIC, UA: NONE SEEN /HPF (ref 0–?)

## 2016-11-15 MED ORDER — AZELASTINE HCL 0.1 % NA SOLN: 1.0000 | Freq: Two times a day (BID) | NASAL | 12 refills | Status: DC

## 2016-11-15 MED ORDER — FLUTICASONE PROPIONATE 50 MCG/ACT NA SUSP: 2.0000 | Freq: Every day | NASAL | 12 refills | Status: DC

## 2016-11-15 MED ORDER — DEXLANSOPRAZOLE 60 MG PO CPDR: 60.0000 mg | DELAYED_RELEASE_CAPSULE | Freq: Every day | ORAL | 12 refills | Status: DC

## 2016-11-15 MED ORDER — ALLOPURINOL 300 MG PO TABS: 300.0000 mg | ORAL_TABLET | Freq: Every day | ORAL | 12 refills | Status: DC

## 2016-11-15 MED ORDER — LOSARTAN POTASSIUM 100 MG PO TABS
100.0000 mg | ORAL_TABLET | Freq: Every day | ORAL | 12 refills | Status: DC
Start: 2016-11-15 — End: 2017-11-22

## 2016-11-15 MED ORDER — AMLODIPINE BESYLATE 10 MG PO TABS: 10.0000 mg | ORAL_TABLET | Freq: Every day | ORAL | 12 refills | Status: DC

## 2016-11-15 NOTE — Assessment & Plan Note (Signed)
The current medical regimen is effective;  continue present plan and medications.  

## 2016-11-15 NOTE — Progress Notes (Signed)
BP 136/87   Pulse 67   Temp 98.4 F (36.9 C) (Oral)   Ht 5' 8.5" (1.74 m)   Wt 215 lb (97.5 kg)   SpO2 99%   BMI 32.21 kg/m    Subjective:    Patient ID: Alexander Mendoza, male    DOB: [birth year], 56 y.o.   MRN: [id number]  HPI: Alexander Mendoza is a 56 y.o. male  Chief Complaint  Patient presents with  . Annual Exam  Patient follow-up doing well no real complaints. Still does have some allergy type symptoms left over URI from flu episode earlier this year. Gout no symptoms taking allopurinol without problems Blood pressure good control with medications no issues Taking testosterone gel without problems. Reflux stable on Dexalant  Relevant past medical, surgical, family and social history reviewed and updated as indicated. Interim medical history since our last visit reviewed. Allergies and medications reviewed and updated.  Review of Systems  Constitutional: Negative.   HENT: Negative.   Eyes: Negative.   Respiratory: Negative.   Cardiovascular: Negative.   Gastrointestinal: Negative.   Endocrine: Negative.   Genitourinary: Negative.   Musculoskeletal: Negative.   Skin: Negative.   Allergic/Immunologic: Negative.   Neurological: Negative.   Hematological: Negative.   Psychiatric/Behavioral: Negative.     Per HPI unless specifically indicated above     Objective:    BP 136/87   Pulse 67   Temp 98.4 F (36.9 C) (Oral)   Ht 5' 8.5" (1.74 m)   Wt 215 lb (97.5 kg)   SpO2 99%   BMI 32.21 kg/m   Wt Readings from Last 3 Encounters:  11/15/16 215 lb (97.5 kg)  10/19/16 220 lb (99.8 kg)  10/13/16 222 lb (100.7 kg)    Physical Exam  Constitutional: He is oriented to person, place, and time. He appears well-developed and well-nourished.  HENT:  Head: Normocephalic and atraumatic.  Right Ear: External ear normal.  Left Ear: External ear normal.  Eyes: Conjunctivae and EOM are normal. Pupils are equal, round, and reactive to light.  Neck: Normal range of  motion. Neck supple.  Cardiovascular: Normal rate, regular rhythm, normal heart sounds and intact distal pulses.   Pulmonary/Chest: Effort normal and breath sounds normal.  Abdominal: Soft. Bowel sounds are normal. There is no splenomegaly or hepatomegaly.  Genitourinary: Rectum normal, prostate normal and penis normal.  Musculoskeletal: Normal range of motion.  Neurological: He is alert and oriented to person, place, and time. He has normal reflexes.  Skin: No rash noted. No erythema.  Psychiatric: He has a normal mood and affect. His behavior is normal. Judgment and thought content normal.    Results for orders placed or performed in visit on 10/19/16  Veritor Flu A/B Waived  Result Value Ref Range   Influenza A Positive (A) Negative   Influenza B Negative Negative      Assessment & Plan:   Problem List Items Addressed This Visit      Cardiovascular and Mediastinum   Hypertension - Primary    The current medical regimen is effective;  continue present plan and medications.       Relevant Medications   amLODipine (NORVASC) 10 MG tablet   losartan (COZAAR) 100 MG tablet   Other Relevant Orders   CBC with Differential/Platelet   Comprehensive metabolic panel   Urinalysis, Routine w reflex microscopic     Digestive   GERD (gastroesophageal reflux disease)    The current medical regimen is effective;  continue present  plan and medications.       Relevant Medications   dexlansoprazole (DEXILANT) 60 MG capsule     Other   Gout    The current medical regimen is effective;  continue present plan and medications.       Relevant Medications   allopurinol (ZYLOPRIM) 300 MG tablet   Other Relevant Orders   Uric acid    Other Visit Diagnoses    Annual physical exam       Relevant Orders   CBC with Differential/Platelet   Comprehensive metabolic panel   Hepatitis C antibody   HIV antibody   Lipid panel   PSA   TSH   Urinalysis, Routine w reflex microscopic   Need  for hepatitis C screening test       Relevant Orders   Hepatitis C antibody   Encounter for screening for HIV       Relevant Orders   HIV antibody   Screening cholesterol level       Relevant Orders   Lipid panel   Prostate cancer screening       Relevant Orders   PSA   Thyroid disorder screen       Relevant Orders   TSH       Follow up plan: Return in about 6 months (around 05/15/2017) for BMP.

## 2016-11-16 ENCOUNTER — Encounter: Payer: Self-pay | Admitting: Family Medicine

## 2016-11-16 LAB — COMPREHENSIVE METABOLIC PANEL
A/G RATIO: 1.5 (ref 1.2–2.2)
ALT: 31 IU/L (ref 0–44)
AST: 25 IU/L (ref 0–40)
Albumin: 3.8 g/dL (ref 3.5–5.5)
Alkaline Phosphatase: 88 IU/L (ref 39–117)
BILIRUBIN TOTAL: 0.6 mg/dL (ref 0.0–1.2)
BUN/Creatinine Ratio: 11 (ref 9–20)
BUN: 12 mg/dL (ref 6–24)
CHLORIDE: 101 mmol/L (ref 96–106)
CO2: 26 mmol/L (ref 18–29)
Calcium: 8.5 mg/dL — ABNORMAL LOW (ref 8.7–10.2)
Creatinine, Ser: 1.1 mg/dL (ref 0.76–1.27)
GFR calc Af Amer: 85 mL/min/{1.73_m2} (ref >59)
GFR calc non Af Amer: 74 mL/min/{1.73_m2} (ref >59)
GLUCOSE: 102 mg/dL — AB (ref 65–99)
Globulin, Total: 2.6 g/dL (ref 1.5–4.5)
POTASSIUM: 4.2 mmol/L (ref 3.5–5.2)
Sodium: 142 mmol/L (ref 134–144)
Total Protein: 6.4 g/dL (ref 6.0–8.5)

## 2016-11-16 LAB — LIPID PANEL
CHOLESTEROL TOTAL: 179 mg/dL (ref 100–199)
Chol/HDL Ratio: 3.8 ratio units (ref 0.0–5.0)
HDL: 47 mg/dL (ref >39)
LDL Calculated: 63 mg/dL (ref 0–99)
TRIGLYCERIDES: 345 mg/dL — AB (ref 0–149)
VLDL Cholesterol Cal: 69 mg/dL — ABNORMAL HIGH (ref 5–40)

## 2016-11-16 LAB — CBC WITH DIFFERENTIAL/PLATELET
BASOS ABS: 0.1 10*3/uL (ref 0.0–0.2)
BASOS: 1 %
EOS (ABSOLUTE): 0.3 10*3/uL (ref 0.0–0.4)
Eos: 4 %
Hematocrit: 47.1 % (ref 37.5–51.0)
Hemoglobin: 15.4 g/dL (ref 13.0–17.7)
IMMATURE GRANS (ABS): 0 10*3/uL (ref 0.0–0.1)
IMMATURE GRANULOCYTES: 0 %
LYMPHS: 21 %
Lymphocytes Absolute: 1.7 10*3/uL (ref 0.7–3.1)
MCH: 29.8 pg (ref 26.6–33.0)
MCHC: 32.7 g/dL (ref 31.5–35.7)
MCV: 91 fL (ref 79–97)
MONOS ABS: 0.8 10*3/uL (ref 0.1–0.9)
Monocytes: 10 %
NEUTROS ABS: 5 10*3/uL (ref 1.4–7.0)
NEUTROS PCT: 64 %
PLATELETS: 265 10*3/uL (ref 150–379)
RBC: 5.17 x10E6/uL (ref 4.14–5.80)
RDW: 15.6 % — AB (ref 12.3–15.4)
WBC: 7.8 10*3/uL (ref 3.4–10.8)

## 2016-11-16 LAB — HEPATITIS C ANTIBODY: HEP C VIRUS AB: 0.1 {s_co_ratio} (ref 0.0–0.9)

## 2016-11-16 LAB — HIV ANTIBODY (ROUTINE TESTING W REFLEX): HIV SCREEN 4TH GENERATION: NONREACTIVE

## 2016-11-16 LAB — URIC ACID: Uric Acid: 4.1 mg/dL (ref 3.7–8.6)

## 2016-11-16 LAB — PSA: Prostate Specific Ag, Serum: 1.9 ng/mL (ref 0.0–4.0)

## 2016-11-16 LAB — TSH: TSH: 2.26 u[IU]/mL (ref 0.450–4.500)

## 2017-04-20 ENCOUNTER — Encounter: Payer: Self-pay | Admitting: Family Medicine

## 2017-04-20 ENCOUNTER — Ambulatory Visit (INDEPENDENT_AMBULATORY_CARE_PROVIDER_SITE_OTHER): Payer: BC Managed Care – PPO | Admitting: Family Medicine

## 2017-04-20 VITALS — BP 148/81 | HR 77 | Temp 99.7°F | Wt 218.0 lb

## 2017-04-20 DIAGNOSIS — J069 Acute upper respiratory infection, unspecified: Secondary | ICD-10-CM | POA: Diagnosis not present

## 2017-04-20 MED ORDER — HYDROCOD POLST-CPM POLST ER 10-8 MG/5ML PO SUER: 5.0000 mL | Freq: Two times a day (BID) | ORAL | 0 refills | Status: DC | PRN

## 2017-04-20 MED ORDER — AMOXICILLIN-POT CLAVULANATE 875-125 MG PO TABS: 1.0000 | ORAL_TABLET | Freq: Two times a day (BID) | ORAL | 0 refills | Status: DC

## 2017-04-20 MED ORDER — ALBUTEROL SULFATE HFA 108 (90 BASE) MCG/ACT IN AERS: 1.0000 | INHALATION_SPRAY | Freq: Four times a day (QID) | RESPIRATORY_TRACT | 3 refills | Status: AC | PRN

## 2017-04-20 MED ORDER — BENZONATATE 100 MG PO CAPS: 200.0000 mg | ORAL_CAPSULE | Freq: Three times a day (TID) | ORAL | 0 refills | Status: DC | PRN

## 2017-04-20 NOTE — Progress Notes (Signed)
   BP (!) 148/81   Pulse 77   Temp 99.7 F (37.6 C)   Wt 218 lb (98.9 kg)   SpO2 97%   BMI 32.66 kg/m    Subjective:    Patient ID: Alexander Mendoza, male    DOB: 29-Apr-1961, 56 y.o.   MRN: [id number]  HPI: Alexander Mendoza is a 56 y.o. male  Chief Complaint  Patient presents with  . Cough    x 2 weeks, occasionally productive. head and upper chest congestion, SOB at times,  occasionally runny nose, ears pop and feel clogged.  No sore throat.   Patient presents with 2 weeks of productive cough, congestion, SOB, rhinorrhea, and ear pressure. Denies fever, chills, N/V/D, CP. Trying mucinex, OTC sinus pills, flonase, and astelin spray, and albuterol inhaler. Inhaler helped some. No sick contacts.   Relevant past medical, surgical, family and social history reviewed and updated as indicated. Interim medical history since our last visit reviewed. Allergies and medications reviewed and updated.  Review of Systems  Constitutional: Negative.   HENT: Positive for congestion, ear pain and rhinorrhea.   Eyes: Negative.   Respiratory: Positive for cough and shortness of breath.   Cardiovascular: Negative.   Gastrointestinal: Negative.   Musculoskeletal: Negative.   Neurological: Negative.   Psychiatric/Behavioral: Negative.     Per HPI unless specifically indicated above     Objective:    BP (!) 148/81   Pulse 77   Temp 99.7 F (37.6 C)   Wt 218 lb (98.9 kg)   SpO2 97%   BMI 32.66 kg/m   Wt Readings from Last 3 Encounters:  04/20/17 218 lb (98.9 kg)  11/15/16 215 lb (97.5 kg)  10/19/16 220 lb (99.8 kg)    Physical Exam  Constitutional: He is oriented to person, place, and time. He appears well-developed and well-nourished. No distress.  HENT:  Head: Atraumatic.  Mouth/Throat: Oropharynx is clear and moist. No oropharyngeal exudate.  Nasal mucosa injected with thick drainage present B/l middle ear effusion, mild  Eyes: Pupils are equal, round, and reactive to light.  Conjunctivae are normal.  Neck: Normal range of motion. Neck supple.  Cardiovascular: Normal rate and normal heart sounds.   Pulmonary/Chest: Effort normal. No respiratory distress. He has wheezes (mild).  Musculoskeletal: Normal range of motion.  Neurological: He is alert and oriented to person, place, and time.  Skin: Skin is warm and dry.  Psychiatric: He has a normal mood and affect. His behavior is normal.  Nursing note and vitals reviewed.     Assessment & Plan:   Problem List Items Addressed This Visit    None    Visit Diagnoses    Upper respiratory tract infection, unspecified type    -  Primary   Augmentin, tessalon, tussionex sent. Precautions reviewed. Supportive care reviewed. F/u if worsening or no improvement       Follow up plan: Return if symptoms worsen or fail to improve.

## 2017-04-21 ENCOUNTER — Encounter: Payer: Self-pay | Admitting: Family Medicine

## 2017-04-23 ENCOUNTER — Encounter: Payer: Self-pay | Admitting: Family Medicine

## 2017-04-24 ENCOUNTER — Other Ambulatory Visit: Payer: Self-pay | Admitting: Family Medicine

## 2017-04-24 MED ORDER — DOXYCYCLINE HYCLATE 100 MG PO TABS: 100.0000 mg | ORAL_TABLET | Freq: Two times a day (BID) | ORAL | 0 refills | Status: DC

## 2017-05-16 ENCOUNTER — Ambulatory Visit: Payer: BC Managed Care – PPO | Admitting: Family Medicine

## 2017-05-22 ENCOUNTER — Encounter: Payer: Self-pay | Admitting: Family Medicine

## 2017-05-22 ENCOUNTER — Ambulatory Visit (INDEPENDENT_AMBULATORY_CARE_PROVIDER_SITE_OTHER): Payer: BC Managed Care – PPO | Admitting: Family Medicine

## 2017-05-22 VITALS — BP 125/86 | HR 77 | Wt 217.0 lb

## 2017-05-22 DIAGNOSIS — K219 Gastro-esophageal reflux disease without esophagitis: Secondary | ICD-10-CM

## 2017-05-22 DIAGNOSIS — I10 Essential (primary) hypertension: Secondary | ICD-10-CM

## 2017-05-22 NOTE — Progress Notes (Signed)
BP 125/86   Pulse 77   Wt 217 lb (98.4 kg)   SpO2 98%   BMI 32.51 kg/m    Subjective:    Patient ID: Alexander Mendoza, male    DOB: Feb 14, 1961, 56 y.o.   MRN: [id number]  HPI: Alexander Mendoza is a 56 y.o. male  Medical prob check Blood pressure doing well no complaints from medication with good control of blood pressure. Reflux may be worse also taking medications without fail Gaviscon and Exelon but may be contributing to patient's cough which is been ongoing. As also been ongoing since had cold month ago. May still be related to some post bronchial infection irritation also. Relevant past medical, surgical, family and social history reviewed and updated as indicated. Interim medical history since our last visit reviewed. Allergies and medications reviewed and updated.  Review of Systems  Constitutional: Negative.   Respiratory: Negative.   Cardiovascular: Negative.     Per HPI unless specifically indicated above     Objective:    BP 125/86   Pulse 77   Wt 217 lb (98.4 kg)   SpO2 98%   BMI 32.51 kg/m   Wt Readings from Last 3 Encounters:  05/22/17 217 lb (98.4 kg)  04/20/17 218 lb (98.9 kg)  11/15/16 215 lb (97.5 kg)    Physical Exam  Constitutional: He is oriented to person, place, and time. He appears well-developed and well-nourished.  HENT:  Head: Normocephalic and atraumatic.  Eyes: Conjunctivae and EOM are normal.  Neck: Normal range of motion.  Cardiovascular: Normal rate, regular rhythm and normal heart sounds.   Pulmonary/Chest: Effort normal and breath sounds normal.  Musculoskeletal: Normal range of motion.  Neurological: He is alert and oriented to person, place, and time.  Skin: No erythema.  Psychiatric: He has a normal mood and affect. His behavior is normal. Judgment and thought content normal.    Results for orders placed or performed in visit on 11/15/16  Microscopic Examination  Result Value Ref Range   WBC, UA 0-5 0 - 5 /hpf   RBC, UA  None seen 0 - 2 /hpf   Epithelial Cells (non renal) 0-10 0 - 10 /hpf   Mucus, UA Present (A) Not Estab.   Bacteria, UA Few (A) None seen/Few  CBC with Differential/Platelet  Result Value Ref Range   WBC 7.8 3.4 - 10.8 x10E3/uL   RBC 5.17 4.14 - 5.80 x10E6/uL   Hemoglobin 15.4 13.0 - 17.7 g/dL   Hematocrit 47.1 37.5 - 51.0 %   MCV 91 79 - 97 fL   MCH 29.8 26.6 - 33.0 pg   MCHC 32.7 31.5 - 35.7 g/dL   RDW 15.6 (H) 12.3 - 15.4 %   Platelets 265 150 - 379 x10E3/uL   Neutrophils 64 Not Estab. %   Lymphs 21 Not Estab. %   Monocytes 10 Not Estab. %   Eos 4 Not Estab. %   Basos 1 Not Estab. %   Neutrophils Absolute 5.0 1.4 - 7.0 x10E3/uL   Lymphocytes Absolute 1.7 0.7 - 3.1 x10E3/uL   Monocytes Absolute 0.8 0.1 - 0.9 x10E3/uL   EOS (ABSOLUTE) 0.3 0.0 - 0.4 x10E3/uL   Basophils Absolute 0.1 0.0 - 0.2 x10E3/uL   Immature Granulocytes 0 Not Estab. %   Immature Grans (Abs) 0.0 0.0 - 0.1 x10E3/uL  Comprehensive metabolic panel  Result Value Ref Range   Glucose 102 (H) 65 - 99 mg/dL   BUN 12 6 - 24 mg/dL  Creatinine, Ser 1.10 0.76 - 1.27 mg/dL   GFR calc non Af Amer 74 >59 mL/min/1.73   GFR calc Af Amer 85 >59 mL/min/1.73   BUN/Creatinine Ratio 11 9 - 20   Sodium 142 134 - 144 mmol/L   Potassium 4.2 3.5 - 5.2 mmol/L   Chloride 101 96 - 106 mmol/L   CO2 26 18 - 29 mmol/L   Calcium 8.5 (L) 8.7 - 10.2 mg/dL   Total Protein 6.4 6.0 - 8.5 g/dL   Albumin 3.8 3.5 - 5.5 g/dL   Globulin, Total 2.6 1.5 - 4.5 g/dL   Albumin/Globulin Ratio 1.5 1.2 - 2.2   Bilirubin Total 0.6 0.0 - 1.2 mg/dL   Alkaline Phosphatase 88 39 - 117 IU/L   AST 25 0 - 40 IU/L   ALT 31 0 - 44 IU/L  Hepatitis C antibody  Result Value Ref Range   Hep C Virus Ab 0.1 0.0 - 0.9 s/co ratio  HIV antibody  Result Value Ref Range   HIV Screen 4th Generation wRfx Non Reactive Non Reactive  Lipid panel  Result Value Ref Range   Cholesterol, Total 179 100 - 199 mg/dL   Triglycerides 345 (H) 0 - 149 mg/dL   HDL 47 >39  mg/dL   VLDL Cholesterol Cal 69 (H) 5 - 40 mg/dL   LDL Calculated 63 0 - 99 mg/dL   Chol/HDL Ratio 3.8 0.0 - 5.0 ratio units  PSA  Result Value Ref Range   Prostate Specific Ag, Serum 1.9 0.0 - 4.0 ng/mL  TSH  Result Value Ref Range   TSH 2.260 0.450 - 4.500 uIU/mL  Urinalysis, Routine w reflex microscopic  Result Value Ref Range   Specific Gravity, UA 1.020 1.005 - 1.030   pH, UA 6.5 5.0 - 7.5   Color, UA Yellow Yellow   Appearance Ur Clear Clear   Leukocytes, UA Negative Negative   Protein, UA 1+ (A) Negative/Trace   Glucose, UA Negative Negative   Ketones, UA Negative Negative   RBC, UA Negative Negative   Bilirubin, UA Negative Negative   Urobilinogen, Ur 0.2 0.2 - 1.0 mg/dL   Nitrite, UA Negative Negative   Microscopic Examination See below:   Uric acid  Result Value Ref Range   Uric Acid 4.1 3.7 - 8.6 mg/dL      Assessment & Plan:   Problem List Items Addressed This Visit      Cardiovascular and Mediastinum   Hypertension - Primary    The current medical regimen is effective;  continue present plan and medications.       Relevant Orders   Basic metabolic panel     Digestive   GERD (gastroesophageal reflux disease)    The current medical regimen is effective;  continue present plan and medications.           Follow up plan: Return in about 6 months (around 11/22/2017) for Physical Exam.

## 2017-05-22 NOTE — Assessment & Plan Note (Signed)
The current medical regimen is effective;  continue present plan and medications.  

## 2017-05-23 ENCOUNTER — Telehealth: Payer: Self-pay | Admitting: Family Medicine

## 2017-05-23 DIAGNOSIS — N183 Chronic kidney disease, stage 3 unspecified: Secondary | ICD-10-CM

## 2017-05-23 LAB — BASIC METABOLIC PANEL
BUN/Creatinine Ratio: 9 (ref 9–20)
BUN: 12 mg/dL (ref 6–24)
CALCIUM: 8.9 mg/dL (ref 8.7–10.2)
CO2: 23 mmol/L (ref 20–29)
CREATININE: 1.36 mg/dL — AB (ref 0.76–1.27)
Chloride: 101 mmol/L (ref 96–106)
GFR calc Af Amer: 66 mL/min/{1.73_m2} (ref >59)
GFR calc non Af Amer: 57 mL/min/{1.73_m2} — ABNORMAL LOW (ref >59)
GLUCOSE: 97 mg/dL (ref 65–99)
POTASSIUM: 4.1 mmol/L (ref 3.5–5.2)
SODIUM: 142 mmol/L (ref 134–144)

## 2017-05-23 NOTE — Telephone Encounter (Signed)
Phone call Discussed with patient slight decline in renal function. Patient not taking any nephrotoxic agents. Has had cold in the last month will recheck BMP 1 month.

## 2017-06-19 ENCOUNTER — Encounter: Payer: Self-pay | Admitting: Family Medicine

## 2017-06-19 DIAGNOSIS — R945 Abnormal results of liver function studies: Secondary | ICD-10-CM

## 2017-06-29 ENCOUNTER — Other Ambulatory Visit: Payer: BC Managed Care – PPO

## 2017-06-29 DIAGNOSIS — R945 Abnormal results of liver function studies: Secondary | ICD-10-CM

## 2017-06-30 LAB — BASIC METABOLIC PANEL
BUN/Creatinine Ratio: 11 (ref 9–20)
BUN: 12 mg/dL (ref 6–24)
CALCIUM: 9 mg/dL (ref 8.7–10.2)
CO2: 26 mmol/L (ref 20–29)
Chloride: 101 mmol/L (ref 96–106)
Creatinine, Ser: 1.14 mg/dL (ref 0.76–1.27)
GFR, EST AFRICAN AMERICAN: 81 mL/min/{1.73_m2} (ref >59)
GFR, EST NON AFRICAN AMERICAN: 70 mL/min/{1.73_m2} (ref >59)
Glucose: 80 mg/dL (ref 65–99)
POTASSIUM: 4.1 mmol/L (ref 3.5–5.2)
Sodium: 141 mmol/L (ref 134–144)

## 2017-11-22 ENCOUNTER — Ambulatory Visit (INDEPENDENT_AMBULATORY_CARE_PROVIDER_SITE_OTHER): Payer: BC Managed Care – PPO | Admitting: Family Medicine

## 2017-11-22 ENCOUNTER — Encounter: Payer: Self-pay | Admitting: Family Medicine

## 2017-11-22 VITALS — BP 134/78 | HR 81 | Ht 68.11 in | Wt 222.0 lb

## 2017-11-22 DIAGNOSIS — K219 Gastro-esophageal reflux disease without esophagitis: Secondary | ICD-10-CM | POA: Diagnosis not present

## 2017-11-22 DIAGNOSIS — M1000 Idiopathic gout, unspecified site: Secondary | ICD-10-CM | POA: Diagnosis not present

## 2017-11-22 DIAGNOSIS — Z125 Encounter for screening for malignant neoplasm of prostate: Secondary | ICD-10-CM

## 2017-11-22 DIAGNOSIS — I10 Essential (primary) hypertension: Secondary | ICD-10-CM

## 2017-11-22 DIAGNOSIS — Z0001 Encounter for general adult medical examination with abnormal findings: Secondary | ICD-10-CM | POA: Diagnosis not present

## 2017-11-22 DIAGNOSIS — Z1329 Encounter for screening for other suspected endocrine disorder: Secondary | ICD-10-CM | POA: Diagnosis not present

## 2017-11-22 DIAGNOSIS — Z0000 Encounter for general adult medical examination without abnormal findings: Secondary | ICD-10-CM

## 2017-11-22 LAB — URINALYSIS, ROUTINE W REFLEX MICROSCOPIC
Bilirubin, UA: NEGATIVE
Glucose, UA: NEGATIVE
Leukocytes, UA: NEGATIVE
NITRITE UA: NEGATIVE
Protein, UA: NEGATIVE
RBC UA: NEGATIVE
Specific Gravity, UA: 1.02 (ref 1.005–1.030)
UUROB: 0.2 mg/dL (ref 0.2–1.0)
pH, UA: 6 (ref 5.0–7.5)

## 2017-11-22 MED ORDER — LOSARTAN POTASSIUM 100 MG PO TABS: 100.0000 mg | ORAL_TABLET | Freq: Every day | ORAL | 12 refills | Status: DC

## 2017-11-22 MED ORDER — AZELASTINE HCL 0.1 % NA SOLN: 1.0000 | Freq: Two times a day (BID) | NASAL | 12 refills | Status: DC

## 2017-11-22 MED ORDER — ALLOPURINOL 300 MG PO TABS: 300.0000 mg | ORAL_TABLET | Freq: Every day | ORAL | 12 refills | Status: DC

## 2017-11-22 MED ORDER — FLUTICASONE PROPIONATE 50 MCG/ACT NA SUSP: 2.0000 | Freq: Every day | NASAL | 12 refills | Status: DC

## 2017-11-22 MED ORDER — DEXLANSOPRAZOLE 60 MG PO CPDR: 60.0000 mg | DELAYED_RELEASE_CAPSULE | Freq: Every day | ORAL | 12 refills | Status: DC

## 2017-11-22 MED ORDER — AMLODIPINE BESYLATE 10 MG PO TABS: 10.0000 mg | ORAL_TABLET | Freq: Every day | ORAL | 12 refills | Status: DC

## 2017-11-22 NOTE — Progress Notes (Signed)
BP 134/78   Pulse 81   Ht 5' 8.11" (1.73 m)   Wt 222 lb (100.7 kg)   SpO2 97%   BMI 33.65 kg/m    Subjective:    Patient ID: Alexander Mendoza, male    DOB: 1961-08-02, 57 y.o.   MRN: [id number]  HPI: Alexander Mendoza is a 57 y.o. male  Chief Complaint  Patient presents with  . Annual Exam   Patient follow-up all in all doing well blood pressure good control no issues with medication. Gout no gout symptoms. Doing well with urology for testosterone replacement and no issues. Inhaler has used couple times but otherwise breathing well has not needed to use.  Relevant past medical, surgical, family and social history reviewed and updated as indicated. Interim medical history since our last visit reviewed. Allergies and medications reviewed and updated.  Review of Systems  Constitutional: Negative.   HENT: Negative.   Eyes: Negative.   Respiratory: Negative.   Cardiovascular: Negative.   Gastrointestinal: Negative.   Endocrine: Negative.   Genitourinary: Negative.   Musculoskeletal: Negative.   Skin: Negative.   Allergic/Immunologic: Negative.   Neurological: Negative.   Hematological: Negative.   Psychiatric/Behavioral: Negative.     Per HPI unless specifically indicated above     Objective:    BP 134/78   Pulse 81   Ht 5' 8.11" (1.73 m)   Wt 222 lb (100.7 kg)   SpO2 97%   BMI 33.65 kg/m   Wt Readings from Last 3 Encounters:  11/22/17 222 lb (100.7 kg)  05/22/17 217 lb (98.4 kg)  04/20/17 218 lb (98.9 kg)    Physical Exam  Constitutional: He is oriented to person, place, and time. He appears well-developed and well-nourished.  HENT:  Head: Normocephalic.  Right Ear: External ear normal.  Left Ear: External ear normal.  Nose: Nose normal.  Eyes: Conjunctivae and EOM are normal. Pupils are equal, round, and reactive to light.  Neck: Normal range of motion. Neck supple. No thyromegaly present.  Cardiovascular: Normal rate, regular rhythm, normal heart sounds  and intact distal pulses.  Pulmonary/Chest: Effort normal and breath sounds normal.  Abdominal: Soft. Bowel sounds are normal. There is no splenomegaly or hepatomegaly.  Musculoskeletal: Normal range of motion.  Lymphadenopathy:    He has no cervical adenopathy.  Neurological: He is alert and oriented to person, place, and time. He has normal reflexes.  Skin: Skin is warm and dry.  Psychiatric: He has a normal mood and affect. His behavior is normal. Judgment and thought content normal.    Results for orders placed or performed in visit on 81/82/99  Basic metabolic panel  Result Value Ref Range   Glucose 80 65 - 99 mg/dL   BUN 12 6 - 24 mg/dL   Creatinine, Ser 1.14 0.76 - 1.27 mg/dL   GFR calc non Af Amer 70 >59 mL/min/1.73   GFR calc Af Amer 81 >59 mL/min/1.73   BUN/Creatinine Ratio 11 9 - 20   Sodium 141 134 - 144 mmol/L   Potassium 4.1 3.5 - 5.2 mmol/L   Chloride 101 96 - 106 mmol/L   CO2 26 20 - 29 mmol/L   Calcium 9.0 8.7 - 10.2 mg/dL      Assessment & Plan:   Problem List Items Addressed This Visit      Cardiovascular and Mediastinum   Hypertension - Primary    The current medical regimen is effective;  continue present plan and medications.  Relevant Medications   losartan (COZAAR) 100 MG tablet   amLODipine (NORVASC) 10 MG tablet   Other Relevant Orders   CBC with Differential/Platelet   Comprehensive metabolic panel   Lipid panel   Urinalysis, Routine w reflex microscopic     Digestive   GERD (gastroesophageal reflux disease)   Relevant Medications   dexlansoprazole (DEXILANT) 60 MG capsule     Other   Gout    The current medical regimen is effective;  continue present plan and medications.       Relevant Medications   allopurinol (ZYLOPRIM) 300 MG tablet   Other Relevant Orders   Uric acid    Other Visit Diagnoses    Prostate cancer screening       Relevant Orders   PSA   Thyroid disorder screen       Relevant Orders   TSH   PE  (physical exam), annual           Follow up plan: Return in about 6 months (around 05/22/2018) for BMP,  Lipids, ALT, AST, uric acid.

## 2017-11-22 NOTE — Assessment & Plan Note (Signed)
The current medical regimen is effective;  continue present plan and medications.  

## 2017-11-23 LAB — CBC WITH DIFFERENTIAL/PLATELET
Basophils Absolute: 0 10*3/uL (ref 0.0–0.2)
Basos: 0 %
EOS (ABSOLUTE): 0.2 10*3/uL (ref 0.0–0.4)
EOS: 3 %
HEMATOCRIT: 48.5 % (ref 37.5–51.0)
HEMOGLOBIN: 16.5 g/dL (ref 13.0–17.7)
IMMATURE GRANULOCYTES: 0 %
Immature Grans (Abs): 0 10*3/uL (ref 0.0–0.1)
Lymphocytes Absolute: 1.8 10*3/uL (ref 0.7–3.1)
Lymphs: 25 %
MCH: 31.5 pg (ref 26.6–33.0)
MCHC: 34 g/dL (ref 31.5–35.7)
MCV: 93 fL (ref 79–97)
MONOS ABS: 0.7 10*3/uL (ref 0.1–0.9)
Monocytes: 10 %
NEUTROS PCT: 62 %
Neutrophils Absolute: 4.4 10*3/uL (ref 1.4–7.0)
Platelets: 231 10*3/uL (ref 150–379)
RBC: 5.24 x10E6/uL (ref 4.14–5.80)
RDW: 15.8 % — AB (ref 12.3–15.4)
WBC: 7.2 10*3/uL (ref 3.4–10.8)

## 2017-11-23 LAB — LIPID PANEL
CHOLESTEROL TOTAL: 217 mg/dL — AB (ref 100–199)
Chol/HDL Ratio: 3.9 ratio (ref 0.0–5.0)
HDL: 55 mg/dL (ref >39)
LDL CALC: 92 mg/dL (ref 0–99)
TRIGLYCERIDES: 351 mg/dL — AB (ref 0–149)
VLDL Cholesterol Cal: 70 mg/dL — ABNORMAL HIGH (ref 5–40)

## 2017-11-23 LAB — COMPREHENSIVE METABOLIC PANEL
ALBUMIN: 3.9 g/dL (ref 3.5–5.5)
ALK PHOS: 81 IU/L (ref 39–117)
ALT: 50 IU/L — AB (ref 0–44)
AST: 36 IU/L (ref 0–40)
Albumin/Globulin Ratio: 1.4 (ref 1.2–2.2)
BILIRUBIN TOTAL: 0.6 mg/dL (ref 0.0–1.2)
BUN/Creatinine Ratio: 13 (ref 9–20)
BUN: 11 mg/dL (ref 6–24)
CALCIUM: 9.1 mg/dL (ref 8.7–10.2)
CHLORIDE: 101 mmol/L (ref 96–106)
CO2: 26 mmol/L (ref 20–29)
CREATININE: 0.85 mg/dL (ref 0.76–1.27)
GFR calc Af Amer: 110 mL/min/{1.73_m2} (ref >59)
GFR calc non Af Amer: 95 mL/min/{1.73_m2} (ref >59)
Globulin, Total: 2.7 g/dL (ref 1.5–4.5)
Glucose: 96 mg/dL (ref 65–99)
Potassium: 4.2 mmol/L (ref 3.5–5.2)
Sodium: 142 mmol/L (ref 134–144)
TOTAL PROTEIN: 6.6 g/dL (ref 6.0–8.5)

## 2017-11-23 LAB — TSH: TSH: 3.43 u[IU]/mL (ref 0.450–4.500)

## 2017-11-23 LAB — PSA: Prostate Specific Ag, Serum: 3.1 ng/mL (ref 0.0–4.0)

## 2017-11-24 ENCOUNTER — Encounter: Payer: Self-pay | Admitting: Family Medicine

## 2017-11-24 LAB — URIC ACID: Uric Acid: 3.8 mg/dL (ref 3.7–8.6)

## 2017-11-24 LAB — SPECIMEN STATUS REPORT

## 2017-12-07 ENCOUNTER — Ambulatory Visit: Payer: BC Managed Care – PPO | Admitting: Family Medicine

## 2017-12-07 ENCOUNTER — Encounter: Payer: Self-pay | Admitting: Family Medicine

## 2017-12-07 VITALS — BP 160/93 | HR 66 | Temp 99.0°F | Wt 224.2 lb

## 2017-12-07 DIAGNOSIS — J069 Acute upper respiratory infection, unspecified: Secondary | ICD-10-CM | POA: Diagnosis not present

## 2017-12-07 LAB — VERITOR FLU A/B WAIVED
Influenza A: NEGATIVE
Influenza B: NEGATIVE

## 2017-12-07 MED ORDER — PREDNISONE 20 MG PO TABS: 40.0000 mg | ORAL_TABLET | Freq: Every day | ORAL | 0 refills | Status: DC

## 2017-12-07 MED ORDER — BENZONATATE 200 MG PO CAPS: 200.0000 mg | ORAL_CAPSULE | Freq: Three times a day (TID) | ORAL | 0 refills | Status: DC | PRN

## 2017-12-07 MED ORDER — AZITHROMYCIN 250 MG PO TABS: ORAL_TABLET | ORAL | 0 refills | Status: DC

## 2017-12-07 NOTE — Progress Notes (Signed)
   BP (!) 160/93 (BP Location: Right Arm, Patient Position: Sitting, Cuff Size: Normal)   Pulse 66   Temp 99 F (37.2 C) (Oral)   Wt 224 lb 3.2 oz (101.7 kg)   SpO2 100%   BMI 33.98 kg/m    Subjective:    Patient ID: Alexander Mendoza, male    DOB: [birth year], 57 y.o.   MRN: [id number]  HPI: Alexander Mendoza is a 57 y.o. male  Chief Complaint  Patient presents with  . Cough  . Sinusitis  . Ear Pain  . Dizziness  . Nasal Congestion   About 2.5 weeks of malaise, productive cough, wheezing, congestion, eye pressure, ear pressure, facial pain and pressure. Denies fever, chills, CP,N/V. Taking mucinex and albuterol with minimal relief. Lots of sick contacts, teaches for a living.   Relevant past medical, surgical, family and social history reviewed and updated as indicated. Interim medical history since our last visit reviewed. Allergies and medications reviewed and updated.  Review of Systems  Per HPI unless specifically indicated above     Objective:    BP (!) 160/93 (BP Location: Right Arm, Patient Position: Sitting, Cuff Size: Normal)   Pulse 66   Temp 99 F (37.2 C) (Oral)   Wt 224 lb 3.2 oz (101.7 kg)   SpO2 100%   BMI 33.98 kg/m   Wt Readings from Last 3 Encounters:  12/07/17 224 lb 3.2 oz (101.7 kg)  11/22/17 222 lb (100.7 kg)  05/22/17 217 lb (98.4 kg)    Physical Exam  Constitutional: He is oriented to person, place, and time. He appears well-developed and well-nourished. No distress.  HENT:  Head: Atraumatic.  Right Ear: External ear normal.  Left Ear: External ear normal.  Oropharynx and nasal mucosa erythematous and edematous  Eyes: Conjunctivae are normal. Pupils are equal, round, and reactive to light. No scleral icterus.  Neck: Normal range of motion. Neck supple.  Cardiovascular: Normal rate and normal heart sounds.  Pulmonary/Chest: Effort normal. He has wheezes.  Musculoskeletal: Normal range of motion.  Neurological: He is alert and oriented to  person, place, and time.  Skin: Skin is warm and dry.  Psychiatric: He has a normal mood and affect. His behavior is normal.  Nursing note and vitals reviewed.  Results for orders placed or performed in visit on 12/07/17  Veritor Flu A/B Waived  Result Value Ref Range   Influenza A Negative Negative   Influenza B Negative Negative      Assessment & Plan:   Problem List Items Addressed This Visit    None    Visit Diagnoses    Upper respiratory tract infection, unspecified type    -  Primary   Will tx with zpack, prednisone,and tessalon. Continue mucinex and albuterol prn, supportive care and return precautions reviewed   Relevant Medications   azithromycin (ZITHROMAX) 250 MG tablet   Other Relevant Orders   Veritor Flu A/B Waived (Completed)       Follow up plan: Return if symptoms worsen or fail to improve.

## 2017-12-09 NOTE — Patient Instructions (Signed)
Follow up as needed

## 2018-03-30 ENCOUNTER — Encounter: Payer: Self-pay | Admitting: Family Medicine

## 2018-05-22 ENCOUNTER — Ambulatory Visit: Payer: BC Managed Care – PPO | Admitting: Family Medicine

## 2018-06-13 ENCOUNTER — Ambulatory Visit: Payer: BC Managed Care – PPO | Admitting: Family Medicine

## 2018-06-13 ENCOUNTER — Encounter: Payer: Self-pay | Admitting: Family Medicine

## 2018-06-13 VITALS — BP 139/83 | HR 62 | Wt 229.0 lb

## 2018-06-13 DIAGNOSIS — M1000 Idiopathic gout, unspecified site: Secondary | ICD-10-CM

## 2018-06-13 DIAGNOSIS — Z13220 Encounter for screening for lipoid disorders: Secondary | ICD-10-CM | POA: Diagnosis not present

## 2018-06-13 DIAGNOSIS — R002 Palpitations: Secondary | ICD-10-CM | POA: Insufficient documentation

## 2018-06-13 DIAGNOSIS — I10 Essential (primary) hypertension: Secondary | ICD-10-CM | POA: Diagnosis not present

## 2018-06-13 DIAGNOSIS — R12 Heartburn: Secondary | ICD-10-CM | POA: Insufficient documentation

## 2018-06-13 MED ORDER — AMLODIPINE BESYLATE 5 MG PO TABS: 5.0000 mg | ORAL_TABLET | Freq: Every day | ORAL | 8 refills | Status: DC

## 2018-06-13 NOTE — Progress Notes (Signed)
   BP 139/83   Pulse 62   Wt 229 lb (103.9 kg)   SpO2 98%   BMI 34.71 kg/m    Subjective:    Patient ID: Alexander Mendoza, male    DOB: 1961-03-11, 57 y.o.   MRN: [id number]  HPI: Alexander Mendoza is a 57 y.o. male  Chief Complaint  Patient presents with  . Follow-up  . Hypertension  . Hyperlipidemia  . Gout  Patient all in all doing well had some gout type symptoms at the base of his thumbs took colchicine and symptoms probably went away.  Otherwise been doing well with blood pressure no complaints from medications taking amlodipine 10 mg half a tablet but now new formulation is a smaller pill and difficult to cut in half wants 5 mg size. Cholesterol not taking any medications and cholesterol is been doing well.  Relevant past medical, surgical, family and social history reviewed and updated as indicated. Interim medical history since our last visit reviewed. Allergies and medications reviewed and updated.  Review of Systems  Constitutional: Negative.   Respiratory: Negative.   Cardiovascular: Negative.     Per HPI unless specifically indicated above     Objective:    BP 139/83   Pulse 62   Wt 229 lb (103.9 kg)   SpO2 98%   BMI 34.71 kg/m   Wt Readings from Last 3 Encounters:  06/13/18 229 lb (103.9 kg)  12/07/17 224 lb 3.2 oz (101.7 kg)  11/22/17 222 lb (100.7 kg)    Physical Exam  Constitutional: He is oriented to person, place, and time. He appears well-developed and well-nourished.  HENT:  Head: Normocephalic and atraumatic.  Eyes: Conjunctivae and EOM are normal.  Neck: Normal range of motion.  Cardiovascular: Normal rate, regular rhythm and normal heart sounds.  Pulmonary/Chest: Effort normal and breath sounds normal.  Musculoskeletal: Normal range of motion.  Neurological: He is alert and oriented to person, place, and time.  Skin: No erythema.  Psychiatric: He has a normal mood and affect. His behavior is normal. Judgment and thought content normal.     Results for orders placed or performed in visit on 12/07/17  Veritor Flu A/B Waived  Result Value Ref Range   Influenza A Negative Negative   Influenza B Negative Negative      Assessment & Plan:   Problem List Items Addressed This Visit      Cardiovascular and Mediastinum   Hypertension - Primary    The current medical regimen is effective;  continue present plan and medications.       Relevant Orders   Uric acid   LP+ALT+AST Piccolo, Waived   Basic metabolic panel   Lipid panel     Other   Gout    The current medical regimen is effective;  continue present plan and medications.       Relevant Orders   Uric acid   LP+ALT+AST Piccolo, Waived   Basic metabolic panel   Lipid panel    Other Visit Diagnoses    Screening cholesterol level       Relevant Orders   Lipid panel       Follow up plan: Return in about 6 months (around 12/12/2018) for Physical Exam.

## 2018-06-13 NOTE — Assessment & Plan Note (Signed)
The current medical regimen is effective;  continue present plan and medications.  

## 2018-06-14 ENCOUNTER — Encounter: Payer: Self-pay | Admitting: Family Medicine

## 2018-06-14 LAB — URIC ACID: Uric Acid: 3.5 mg/dL — ABNORMAL LOW (ref 3.7–8.6)

## 2018-06-14 LAB — BASIC METABOLIC PANEL
BUN / CREAT RATIO: 12 (ref 9–20)
BUN: 12 mg/dL (ref 6–24)
CALCIUM: 8.8 mg/dL (ref 8.7–10.2)
CO2: 28 mmol/L (ref 20–29)
CREATININE: 1.03 mg/dL (ref 0.76–1.27)
Chloride: 101 mmol/L (ref 96–106)
GFR calc Af Amer: 91 mL/min/{1.73_m2} (ref >59)
GFR calc non Af Amer: 79 mL/min/{1.73_m2} (ref >59)
GLUCOSE: 90 mg/dL (ref 65–99)
Potassium: 4.5 mmol/L (ref 3.5–5.2)
Sodium: 144 mmol/L (ref 134–144)

## 2018-06-14 LAB — LIPID PANEL
CHOLESTEROL TOTAL: 219 mg/dL — AB (ref 100–199)
Chol/HDL Ratio: 4.1 ratio (ref 0.0–5.0)
HDL: 54 mg/dL (ref >39)
LDL CALC: 93 mg/dL (ref 0–99)
TRIGLYCERIDES: 360 mg/dL — AB (ref 0–149)
VLDL CHOLESTEROL CAL: 72 mg/dL — AB (ref 5–40)

## 2018-07-20 ENCOUNTER — Encounter: Payer: Self-pay | Admitting: Family Medicine

## 2018-07-20 ENCOUNTER — Ambulatory Visit: Payer: BC Managed Care – PPO | Admitting: Family Medicine

## 2018-07-20 VITALS — BP 158/91 | HR 56 | Temp 98.5°F | Ht 68.0 in | Wt 225.4 lb

## 2018-07-20 DIAGNOSIS — J0100 Acute maxillary sinusitis, unspecified: Secondary | ICD-10-CM | POA: Diagnosis not present

## 2018-07-20 MED ORDER — DOXYCYCLINE HYCLATE 100 MG PO TABS: 100.0000 mg | ORAL_TABLET | Freq: Two times a day (BID) | ORAL | 0 refills | Status: DC

## 2018-07-20 MED ORDER — AMOXICILLIN-POT CLAVULANATE 875-125 MG PO TABS: 1.0000 | ORAL_TABLET | Freq: Two times a day (BID) | ORAL | 0 refills | Status: DC

## 2018-07-20 NOTE — Progress Notes (Signed)
BP (!) 158/91   Pulse (!) 56   Temp 98.5 F (36.9 C) (Oral)   Ht 5\' 8"  (1.727 m)   Wt 225 lb 6.4 oz (102.2 kg)   SpO2 98%   BMI 34.27 kg/m    Subjective:    Patient ID: Alexander Mendoza, male    DOB: [birth year], 57 y.o.   MRN: [id number]  HPI: [name] is a 57 y.o. male  Chief Complaint  Patient presents with  . URI    pt states he has had a cough, congestion, sinus pressure, and ear ache for about a week and a half now    Productive cough, congestion, SOB, ear pain and pressure, facial pain and pressure for almost 2 weeks. Trying tessalon perles and inhaler with some mild temporary relief. Lots of sick contacts at work. Denies CP, fevers, chills, N/V/D.   Relevant past medical, surgical, family and social history reviewed and updated as indicated. Interim medical history since our last visit reviewed. Allergies and medications reviewed and updated.  Review of Systems  Per HPI unless specifically indicated above     Objective:    BP (!) 158/91   Pulse (!) 56   Temp 98.5 F (36.9 C) (Oral)   Ht 5\' 8"  (1.727 m)   Wt 225 lb 6.4 oz (102.2 kg)   SpO2 98%   BMI 34.27 kg/m   Wt Readings from Last 3 Encounters:  07/20/18 225 lb 6.4 oz (102.2 kg)  06/13/18 229 lb (103.9 kg)  12/07/17 224 lb 3.2 oz (101.7 kg)    Physical Exam  Constitutional: He is oriented to person, place, and time. He appears well-developed and well-nourished. No distress.  HENT:  Head: Atraumatic.  Right Ear: External ear normal.  Left Ear: External ear normal.  Oropharynx and nasal mucosa erythematous and edematous with thick drainage present. B/l maxillary sinuses ttp  Eyes: Conjunctivae and EOM are normal.  Neck: Normal range of motion. Neck supple.  Cardiovascular: Normal rate and regular rhythm.  Pulmonary/Chest: Effort normal and breath sounds normal.  Musculoskeletal: Normal range of motion.  Lymphadenopathy:    He has no cervical adenopathy.  Neurological: He is alert and oriented  to person, place, and time.  Skin: Skin is warm and dry.  Psychiatric: He has a normal mood and affect. His behavior is normal.  Nursing note and vitals reviewed.   Results for orders placed or performed in visit on 06/13/18  Uric acid  Result Value Ref Range   Uric Acid 3.5 (L) 3.7 - 8.6 mg/dL  Basic metabolic panel  Result Value Ref Range   Glucose 90 65 - 99 mg/dL   BUN 12 6 - 24 mg/dL   Creatinine, Ser 1.03 0.76 - 1.27 mg/dL   GFR calc non Af Amer 79 >59 mL/min/1.73   GFR calc Af Amer 91 >59 mL/min/1.73   BUN/Creatinine Ratio 12 9 - 20   Sodium 144 134 - 144 mmol/L   Potassium 4.5 3.5 - 5.2 mmol/L   Chloride 101 96 - 106 mmol/L   CO2 28 20 - 29 mmol/L   Calcium 8.8 8.7 - 10.2 mg/dL  Lipid panel  Result Value Ref Range   Cholesterol, Total 219 (H) 100 - 199 mg/dL   Triglycerides 360 (H) 0 - 149 mg/dL   HDL 54 >39 mg/dL   VLDL Cholesterol Cal 72 (H) 5 - 40 mg/dL   LDL Calculated 93 0 - 99 mg/dL   Chol/HDL Ratio 4.1 0.0 -  5.0 ratio      Assessment & Plan:   Problem List Items Addressed This Visit    None    Visit Diagnoses    Acute maxillary sinusitis, recurrence not specified    -  Primary   Tx with doxycycline, flonase, and mucinex. Continue albuterol prn. Supportive care reviewed. F/u if worsening or not improving.    Relevant Medications   amoxicillin-clavulanate (AUGMENTIN) 875-125 MG tablet   doxycycline (VIBRA-TABS) 100 MG tablet       Follow up plan: Return for as scheduled.

## 2018-07-23 NOTE — Patient Instructions (Signed)
Follow up as needed

## 2018-12-13 ENCOUNTER — Encounter: Payer: BC Managed Care – PPO | Admitting: Family Medicine

## 2019-01-01 ENCOUNTER — Other Ambulatory Visit: Payer: Self-pay | Admitting: Family Medicine

## 2019-01-01 DIAGNOSIS — K219 Gastro-esophageal reflux disease without esophagitis: Secondary | ICD-10-CM

## 2019-01-01 DIAGNOSIS — I10 Essential (primary) hypertension: Secondary | ICD-10-CM

## 2019-01-01 DIAGNOSIS — M1000 Idiopathic gout, unspecified site: Secondary | ICD-10-CM

## 2019-01-23 ENCOUNTER — Ambulatory Visit (INDEPENDENT_AMBULATORY_CARE_PROVIDER_SITE_OTHER): Payer: BC Managed Care – PPO | Admitting: Family Medicine

## 2019-01-23 ENCOUNTER — Encounter: Payer: Self-pay | Admitting: Family Medicine

## 2019-01-23 DIAGNOSIS — M1000 Idiopathic gout, unspecified site: Secondary | ICD-10-CM | POA: Diagnosis not present

## 2019-01-23 DIAGNOSIS — I10 Essential (primary) hypertension: Secondary | ICD-10-CM | POA: Diagnosis not present

## 2019-01-23 DIAGNOSIS — K219 Gastro-esophageal reflux disease without esophagitis: Secondary | ICD-10-CM | POA: Diagnosis not present

## 2019-01-23 NOTE — Assessment & Plan Note (Signed)
The current medical regimen is effective;  continue present plan and medications.  

## 2019-01-23 NOTE — Progress Notes (Signed)
There were no vitals taken for this visit.   Subjective:    Patient ID: Alexander Mendoza, male    DOB: [birth year], 58 y.o.   MRN: [id number]  HPI: Alexander Mendoza is a 58 y.o. male  Med check Patient follow-up hypertension doing well taking medications faithfully without problems. Allergy no spray is working well without problems. Reflux stable with no problems taking medications faithfully with good effect.  Working with urology on testosterone replacement with good effect. No gout signs or symptoms taking allopurinol without problems.  Telemedicine using audio/video telecommunications for a synchronous communication visit. Today's visit due to COVID-19 isolation precautions I connected with and verified that I am speaking with the correct person using two identifiers.   I discussed the limitations, risks, security and privacy concerns of performing an evaluation and management service by telecommunication and the availability of in person appointments. I also discussed with the patient that there may be a patient responsible charge related to this service. The patient expressed understanding and agreed to proceed. The patient's location is home. I am at home. Relevant past medical, surgical, family and social history reviewed and updated as indicated. Interim medical history since our last visit reviewed. Allergies and medications reviewed and updated.  Review of Systems  Constitutional: Negative.   Respiratory: Negative.   Cardiovascular: Negative.     Per HPI unless specifically indicated above     Objective:    There were no vitals taken for this visit.  Wt Readings from Last 3 Encounters:  07/20/18 225 lb 6.4 oz (102.2 kg)  06/13/18 229 lb (103.9 kg)  12/07/17 224 lb 3.2 oz (101.7 kg)    Physical Exam  Results for orders placed or performed in visit on 06/13/18  Uric acid  Result Value Ref Range   Uric Acid 3.5 (L) 3.7 - 8.6 mg/dL  Basic metabolic panel  Result  Value Ref Range   Glucose 90 65 - 99 mg/dL   BUN 12 6 - 24 mg/dL   Creatinine, Ser 1.03 0.76 - 1.27 mg/dL   GFR calc non Af Amer 79 >59 mL/min/1.73   GFR calc Af Amer 91 >59 mL/min/1.73   BUN/Creatinine Ratio 12 9 - 20   Sodium 144 134 - 144 mmol/L   Potassium 4.5 3.5 - 5.2 mmol/L   Chloride 101 96 - 106 mmol/L   CO2 28 20 - 29 mmol/L   Calcium 8.8 8.7 - 10.2 mg/dL  Lipid panel  Result Value Ref Range   Cholesterol, Total 219 (H) 100 - 199 mg/dL   Triglycerides 360 (H) 0 - 149 mg/dL   HDL 54 >39 mg/dL   VLDL Cholesterol Cal 72 (H) 5 - 40 mg/dL   LDL Calculated 93 0 - 99 mg/dL   Chol/HDL Ratio 4.1 0.0 - 5.0 ratio      Assessment & Plan:   Problem List Items Addressed This Visit      Cardiovascular and Mediastinum   Hypertension    The current medical regimen is effective;  continue present plan and medications.         Digestive   GERD (gastroesophageal reflux disease)    The current medical regimen is effective;  continue present plan and medications.         Other   Gout    The current medical regimen is effective;  continue present plan and medications.          I discussed the assessment and treatment plan with the  patient. The patient was provided an opportunity to ask questions and all were answered. The patient agreed with the plan and demonstrated an understanding of the instructions.   The patient was advised to call back or seek an in-person evaluation if the symptoms worsen or if the condition fails to improve as anticipated.   I provided 21+ minutes of time during this encounter. Follow up plan: Return in about 3 months (around 04/24/2019) for Physical Exam.

## 2019-03-13 ENCOUNTER — Other Ambulatory Visit: Payer: Self-pay | Admitting: Family Medicine

## 2019-03-13 DIAGNOSIS — I10 Essential (primary) hypertension: Secondary | ICD-10-CM

## 2019-04-15 ENCOUNTER — Encounter: Payer: BC Managed Care – PPO | Admitting: Family Medicine

## 2019-04-15 ENCOUNTER — Encounter: Payer: Self-pay | Admitting: Family Medicine

## 2019-04-24 ENCOUNTER — Encounter: Payer: Self-pay | Admitting: Family Medicine

## 2019-04-25 ENCOUNTER — Encounter: Payer: Self-pay | Admitting: Family Medicine

## 2019-04-25 ENCOUNTER — Other Ambulatory Visit: Payer: Self-pay

## 2019-04-25 ENCOUNTER — Ambulatory Visit (INDEPENDENT_AMBULATORY_CARE_PROVIDER_SITE_OTHER): Payer: BC Managed Care – PPO | Admitting: Family Medicine

## 2019-04-25 DIAGNOSIS — I10 Essential (primary) hypertension: Secondary | ICD-10-CM | POA: Diagnosis not present

## 2019-04-25 DIAGNOSIS — M1000 Idiopathic gout, unspecified site: Secondary | ICD-10-CM

## 2019-04-25 NOTE — Progress Notes (Signed)
BP (!) 165/70    Subjective:    Patient ID: Alexander Mendoza, male    DOB: 01/19/1961, 58 y.o.   MRN: [id number]  HPI: Alexander Mendoza is a 58 y.o. 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 Patient is home now checking his blood pressure and it is 165/70. Patient's urine has been decreased and is dark yellow. No gout symptoms otherwise blood pressures been doing well. Relevant past medical, surgical, family and social history reviewed and updated as indicated. Interim medical history since our last visit reviewed. Allergies and medications reviewed and updated.  Review of Systems  Constitutional: Positive for fatigue.  Respiratory: Negative.   Cardiovascular: Negative.     Per HPI unless specifically indicated above     Objective:    BP (!) 165/70   Wt Readings from Last 3 Encounters:  07/20/18 225 lb 6.4 oz (102.2 kg)  06/13/18 229 lb (103.9 kg)  12/07/17 224 lb 3.2 oz (101.7 kg)    Physical Exam  Results for orders placed or performed in visit on 06/13/18  Uric acid  Result Value Ref Range   Uric Acid 3.5 (L) 3.7 - 8.6 mg/dL  Basic metabolic panel  Result Value Ref Range   Glucose 90 65 - 99 mg/dL   BUN 12 6 - 24 mg/dL   Creatinine, Ser 1.03 0.76 - 1.27 mg/dL   GFR calc non Af Amer 79 >59 mL/min/1.73   GFR calc Af Amer 91 >59 mL/min/1.73   BUN/Creatinine Ratio 12 9 - 20   Sodium 144 134 - 144 mmol/L   Potassium 4.5 3.5 - 5.2 mmol/L   Chloride 101 96 - 106 mmol/L   CO2 28 20 - 29 mmol/L   Calcium 8.8 8.7 - 10.2 mg/dL  Lipid panel  Result Value Ref Range   Cholesterol, Total 219 (H) 100 - 199 mg/dL   Triglycerides 360 (H) 0 - 149 mg/dL   HDL 54 >39 mg/dL   VLDL Cholesterol Cal 72 (H) 5 - 40 mg/dL   LDL Calculated 93 0 - 99 mg/dL   Chol/HDL Ratio 4.1 0.0 - 5.0 ratio      Assessment & Plan:   Problem List Items Addressed This Visit       Cardiovascular and Mediastinum   Hypertension    Discuss hypertension and blood pressure too high now but may be reactive to patient's dehydration.  Discussed dehydration and medication will continue blood pressure medicines for now.  Patient will check blood pressure before and after work and has physical in 2 weeks will check again then.  Discussed dehydration issues drinking enough water that his urine is clear and is having to urinate frequently.        Other   Gout    The current medical regimen is effective;  continue present plan and medications.         Telemedicine using audio/video telecommunications for a synchronous communication visit. Today's visit due to COVID-19 isolation precautions I connected with and verified that I am speaking with the correct person using two identifiers.   I discussed the limitations, risks, security and privacy concerns of performing an evaluation and management service by telecommunication and the availability of in person appointments. I also discussed with the patient that there may be a patient responsible charge related to this service. The patient expressed understanding  and agreed to proceed. The patient's location is home. I am at home.   I discussed the assessment and treatment plan with the patient. The patient was provided an opportunity to ask questions and all were answered. The patient agreed with the plan and demonstrated an understanding of the instructions.   The patient was advised to call back or seek an in-person evaluation if the symptoms worsen or if the condition fails to improve as anticipated.   I provided 21+ minutes of time during this encounter.  Follow up plan: Return for As scheduled.

## 2019-04-25 NOTE — Assessment & Plan Note (Signed)
The current medical regimen is effective;  continue present plan and medications.  

## 2019-04-25 NOTE — Telephone Encounter (Signed)
This is a [facility] patient, let's get him a virtual visit ASAP to discuss these concerns with PCP please. These should not wait for a CPE

## 2019-04-25 NOTE — Assessment & Plan Note (Signed)
Discuss hypertension and blood pressure too high now but may be reactive to patient's dehydration.  Discussed dehydration and medication will continue blood pressure medicines for now.  Patient will check blood pressure before and after work and has physical in 2 weeks will check again then.  Discussed dehydration issues drinking enough water that his urine is clear and is having to urinate frequently.

## 2019-04-25 NOTE — Telephone Encounter (Signed)
Patient scheduled to discuss w/ Dr. Jeananne Rama.

## 2019-05-16 ENCOUNTER — Encounter: Payer: Self-pay | Admitting: Family Medicine

## 2019-05-16 ENCOUNTER — Other Ambulatory Visit (HOSPITAL_COMMUNITY)
Admission: RE | Admit: 2019-05-16 | Discharge: 2019-05-16 | Disposition: A | Discharge status: Home / Self Care | Payer: BC Managed Care – PPO | Source: Ambulatory Visit | Attending: Family Medicine | Admitting: Family Medicine

## 2019-05-16 ENCOUNTER — Other Ambulatory Visit: Payer: Self-pay

## 2019-05-16 ENCOUNTER — Ambulatory Visit (INDEPENDENT_AMBULATORY_CARE_PROVIDER_SITE_OTHER): Payer: BC Managed Care – PPO | Admitting: Family Medicine

## 2019-05-16 VITALS — BP 135/87 | HR 64 | Temp 98.6°F | Ht 68.23 in | Wt 221.6 lb

## 2019-05-16 DIAGNOSIS — D489 Neoplasm of uncertain behavior, unspecified: Secondary | ICD-10-CM | POA: Diagnosis not present

## 2019-05-16 DIAGNOSIS — R42 Dizziness and giddiness: Secondary | ICD-10-CM | POA: Diagnosis not present

## 2019-05-16 DIAGNOSIS — Z23 Encounter for immunization: Secondary | ICD-10-CM | POA: Diagnosis not present

## 2019-05-16 DIAGNOSIS — Z0000 Encounter for general adult medical examination without abnormal findings: Secondary | ICD-10-CM | POA: Diagnosis not present

## 2019-05-16 LAB — UA/M W/RFLX CULTURE, ROUTINE
Bilirubin, UA: NEGATIVE
Glucose, UA: NEGATIVE
Ketones, UA: NEGATIVE
Leukocytes,UA: NEGATIVE
Nitrite, UA: NEGATIVE
Protein,UA: NEGATIVE
RBC, UA: NEGATIVE
Specific Gravity, UA: 1.015 (ref 1.005–1.030)
Urobilinogen, Ur: 0.2 mg/dL (ref 0.2–1.0)
pH, UA: 7 (ref 5.0–7.5)

## 2019-05-16 NOTE — Patient Instructions (Signed)
Td Vaccine (Tetanus and Diphtheria): What You Need to Know 1. Why get vaccinated? Tetanus  and diphtheria are very serious diseases. They are rare in the [place] today, but people who do become infected often have severe complications. Td vaccine is used to protect adolescents and adults from both of these diseases. Both tetanus and diphtheria are infections caused by bacteria. Diphtheria spreads from person to person through coughing or sneezing. Tetanus-causing bacteria enter the body through cuts, scratches, or wounds. TETANUS (Lockjaw) causes painful muscle tightening and stiffness, usually all over the body.  It can lead to tightening of muscles in the head and neck so you can't open your mouth, swallow, or sometimes even breathe. Tetanus kills about 1 out of every 10 people who are infected even after receiving the best medical care. DIPHTHERIA can cause a thick coating to form in the back of the throat.  It can lead to breathing problems, paralysis, heart failure, and death. Before vaccines, as many as 200,000 cases of diphtheria and hundreds of cases of tetanus were reported in the [place] each year. Since vaccination began, reports of cases for both diseases have dropped by about 99%. 2. Td vaccine Td vaccine can protect adolescents and adults from tetanus and diphtheria. Td is usually given as a booster dose every 10 years but it can also be given earlier after a severe and dirty wound or burn. Another vaccine, called Tdap, which protects against pertussis in addition to tetanus and diphtheria, is sometimes recommended instead of Td vaccine. Your doctor or the person giving you the vaccine can give you more information. Td may safely be given at the same time as other vaccines. 3. Some people should not get this vaccine  A person who has ever had a life-threatening allergic reaction after a previous dose of any tetanus or diphtheria containing vaccine, OR has a severe allergy  to any part of this vaccine, should not get Td vaccine. Tell the person giving the vaccine about any severe allergies.  Talk to your doctor if you: ? had severe pain or swelling after any vaccine containing diphtheria or tetanus, ? ever had a condition called Guillain Barr Syndrome (GBS), ? aren't feeling well on the day the shot is scheduled. 4. Risks of a vaccine reaction With any medicine, including vaccines, there is a chance of side effects. These are usually mild and go away on their own. Serious reactions are also possible but are rare. Most people who get Td vaccine do not have any problems with it. Mild Problems following Td vaccine: (Did not interfere with activities)  Pain where the shot was given (about 8 people in 10)  Redness or swelling where the shot was given (about 1 person in 4)  Mild fever (rare)  Headache (about 1 person in 4)  Tiredness (about 1 person in 4) Moderate Problems following Td vaccine: (Interfered with activities, but did not require medical attention)  Fever over 102F (rare) Severe Problems following Td vaccine: (Unable to perform usual activities; required medical attention)  Swelling, severe pain, bleeding and/or redness in the arm where the shot was given (rare). Problems that could happen after any vaccine:  People sometimes faint after a medical procedure, including vaccination. Sitting or lying down for about 15 minutes can help prevent fainting, and injuries caused by a fall. Tell your doctor if you feel dizzy, or have vision changes or ringing in the ears.  Some people get severe pain in the shoulder and have   difficulty moving the arm where a shot was given. This happens very rarely.  Any medication can cause a severe allergic reaction. Such reactions from a vaccine are very rare, estimated at fewer than 1 in a million doses, and would happen within a few minutes to a few hours after the vaccination. As with any medicine, there is a  very remote chance of a vaccine causing a serious injury or death. The safety of vaccines is always being monitored. For more information, visit: www.cdc.gov/vaccinesafety/ 5. What if there is a serious reaction? What should I look for?  Look for anything that concerns you, such as signs of a severe allergic reaction, very high fever, or unusual behavior. Signs of a severe allergic reaction can include hives, swelling of the face and throat, difficulty breathing, a fast heartbeat, dizziness, and weakness. These would usually start a few minutes to a few hours after the vaccination. What should I do?  If you think it is a severe allergic reaction or other emergency that can't wait, call 9-1-1 or get the person to the nearest hospital. Otherwise, call your doctor.  Afterward, the reaction should be reported to the Vaccine Adverse Event Reporting System (VAERS). Your doctor might file this report, or you can do it yourself through the VAERS web site at www.vaers.hhs.gov, or by calling 1-800-822-7967. VAERS does not give medical advice. 6. The National Vaccine Injury Compensation Program The National Vaccine Injury Compensation Program (VICP) is a federal program that was created to compensate people who may have been injured by certain vaccines. Persons who believe they may have been injured by a vaccine can learn about the program and about filing a claim by calling 1-800-338-2382 or visiting the VICP website at www.hrsa.gov/vaccinecompensation. There is a time limit to file a claim for compensation. 7. How can I learn more?  Ask your doctor. He or she can give you the vaccine package insert or suggest other sources of information.  Call your local or state health department.  Contact the Centers for Disease Control and Prevention (CDC): ? Call 1-800-232-4636 (1-800-CDC-INFO) ? Visit CDC's website at www.cdc.gov/vaccines Vaccine Information Statement Td Vaccine (01/19/16) This information is  not intended to replace advice given to you by your health care provider. Make sure you discuss any questions you have with your health care provider. Document Released: 07/24/2006 Document Revised: 05/14/2018 Document Reviewed: 05/14/2018 [employer] Interactive Patient Education  2020 [employer]  

## 2019-05-16 NOTE — Progress Notes (Signed)
BP 135/87   Pulse 64   Temp 98.6 F (37 C)   Ht 5' 8.23" (1.733 m)   Wt 221 lb 9 oz (100.5 kg)   SpO2 100%   BMI 33.46 kg/m    Subjective:    Patient ID: Alexander Mendoza, male    DOB: Jul 13, 1961, 58 y.o.   MRN: [id number]  HPI: Alexander Mendoza is a 58 y.o. male presenting on 05/16/2019 for comprehensive medical examination. 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 Tries to stay hydrated, drinking bottled waters and gatorade throughout the day. Denies room spinning, syncope, visual changes, headaches, CP, SOB, palpitations. Has started checking home BPs and typically getting between 120-130s/60-80s. Taking medication faithfully.   Also has a skin lesion on right upper chest that he's worried about. Has been there for years but the past 6 months or so has noticed growth. Does not have a hx of known skin cancers.   He currently lives with: Interim Problems from his last visit: yes  Depression Screen done today and results listed below:  Depression screen [facility] 2/9 05/16/2019 11/22/2017 05/22/2017 11/15/2016  Decreased Interest 0 0 0 0  Down, Depressed, Hopeless 0 0 0 0  PHQ - 2 Score 0 0 0 0  Altered sleeping - 1 - -  Tired, decreased energy - 0 - -  Change in appetite - 0 - -  Feeling bad or failure about yourself  - 0 - -  Trouble concentrating - 0 - -  Moving slowly or fidgety/restless - 0 - -  Suicidal thoughts - 0 - -  PHQ-9 Score - 1 - -    The patient does not have a history of falls. I did complete a risk assessment for falls. A plan of care for falls was documented.   Past Medical History:  Past Medical History:  Diagnosis Date  . Allergy   . Chronic kidney disease   . GERD (gastroesophageal reflux disease)   . Hyperlipidemia   . Hypertension     Surgical History:  Past Surgical History:  Procedure Laterality Date  .  VASECTOMY      Medications:  Current Outpatient Medications on File Prior to Visit  Medication Sig  . albuterol (PROVENTIL HFA;VENTOLIN HFA) 108 (90 Base) MCG/ACT inhaler Inhale 1 puff into the lungs every 6 (six) hours as needed for wheezing or shortness of breath.  . allopurinol (ZYLOPRIM) 300 MG tablet TAKE 1 TABLET BY MOUTH EVERY DAY  . amLODipine (NORVASC) 5 MG tablet TAKE 1 TABLET BY MOUTH EVERY DAY  . AXIRON 30 MG/ACT SOLN APPLY 60MG  PER DAY  . azelastine (ASTELIN) 0.1 % nasal spray Place 1 spray into both nostrils 2 (two) times daily.  Marland Kitchen DEXILANT 60 MG capsule TAKE 1 CAPSULE BY MOUTH EVERY DAY  . fluticasone (FLONASE) 50 MCG/ACT nasal spray Place 2 sprays into both nostrils daily.  Marland Kitchen losartan (COZAAR) 100 MG tablet TAKE 1 TABLET BY MOUTH EVERY DAY  . Magnesium Citrate 100 MG TABS Take by mouth.  . Multiple Vitamin (MULTI-VITAMINS) TABS Take by mouth daily.  . Omega-3 Fatty Acids (FISH OIL) 1200 MG CAPS Take 2,400 mg by mouth daily.  . tadalafil (CIALIS) 5 MG tablet Take 5 mg by mouth daily as needed for erectile dysfunction.  . vitamin E 400 UNIT capsule Take 800 Units by mouth daily.   No  current facility-administered medications on file prior to visit.     Allergies:  Allergies  Allergen Reactions  . Benazepril Cough    Social History:  Social History   Socioeconomic History  . Marital status: Married    Spouse name: Not on file  . Number of children: Not on file  . Years of education: Not on file  . Highest education level: Not on file  Occupational History  . Not on file  Social Needs  . Financial resource strain: Not on file  . Food insecurity    Worry: Not on file    Inability: Not on file  . Transportation needs    Medical: Not on file    Non-medical: Not on file  Tobacco Use  . Smoking status: Never Smoker  . Smokeless tobacco: Current User    Types: Snuff  Substance and Sexual Activity  . Alcohol use: Yes  . Drug use: No  . Sexual activity: Not  on file  Lifestyle  . Physical activity    Days per week: Not on file    Minutes per session: Not on file  . Stress: Not on file  Relationships  . Social Herbalist on phone: Not on file    Gets together: Not on file    Attends religious service: Not on file    Active member of club or organization: Not on file    Attends meetings of clubs or organizations: Not on file    Relationship status: Not on file  . Intimate partner violence    Fear of current or ex partner: Not on file    Emotionally abused: Not on file    Physically abused: Not on file    Forced sexual activity: Not on file  Other Topics Concern  . Not on file  Social History Narrative  . Not on file   Social History   Tobacco Use  Smoking Status Never Smoker  Smokeless Tobacco Current User  . Types: Snuff   Social History   Substance and Sexual Activity  Alcohol Use Yes    Family History:  Family History  Problem Relation Age of Onset  . Cancer Mother        Breast  . Hypertension Father   . Cancer Brother        Testicular    Past medical history, surgical history, medications, allergies, family history and social history reviewed with patient today and changes made to appropriate areas of the chart.   Review of Systems - General ROS: negative Psychological ROS: negative Ophthalmic ROS: negative ENT ROS: negative Allergy and Immunology ROS: negative Hematological and Lymphatic ROS: negative Endocrine ROS: negative Breast ROS: negative for breast lumps Respiratory ROS: no cough, shortness of breath, or wheezing Cardiovascular ROS: no chest pain or dyspnea on exertion Gastrointestinal ROS: no abdominal pain, change in bowel habits, or black or bloody stools Genito-Urinary ROS: no dysuria, trouble voiding, or hematuria Musculoskeletal ROS: negative Neurological ROS: positive for - dizziness Dermatological ROS: positive for skin lesion changes All other ROS negative except what is  listed above and in the HPI.      Objective:    BP 135/87   Pulse 64   Temp 98.6 F (37 C)   Ht 5' 8.23" (1.733 m)   Wt 221 lb 9 oz (100.5 kg)   SpO2 100%   BMI 33.46 kg/m   Wt Readings from Last 3 Encounters:  05/16/19 221 lb 9 oz (100.5 kg)  07/20/18 225 lb 6.4 oz (102.2 kg)  06/13/18 229 lb (103.9 kg)    Physical Exam Vitals signs and nursing note reviewed.  Constitutional:      General: He is not in acute distress.    Appearance: He is well-developed.  HENT:     Head: Atraumatic.     Right Ear: Tympanic membrane and external ear normal.     Left Ear: Tympanic membrane and external ear normal.     Nose: Nose normal.     Mouth/Throat:     Mouth: Mucous membranes are moist.     Pharynx: Oropharynx is clear.  Eyes:     General: No scleral icterus.    Conjunctiva/sclera: Conjunctivae normal.     Pupils: Pupils are equal, round, and reactive to light.  Neck:     Musculoskeletal: Normal range of motion and neck supple.  Cardiovascular:     Rate and Rhythm: Normal rate and regular rhythm.     Heart sounds: Normal heart sounds. No murmur.  Pulmonary:     Effort: Pulmonary effort is normal. No respiratory distress.     Breath sounds: Normal breath sounds.  Abdominal:     General: Bowel sounds are normal. There is no distension.     Palpations: Abdomen is soft. There is no mass.     Tenderness: There is no abdominal tenderness. There is no guarding.  Genitourinary:    Comments: GU exam declined Musculoskeletal: Normal range of motion.        General: No tenderness.  Skin:    General: Skin is warm and dry.     Findings: No rash.     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     Mental Status: He is alert and oriented to person, place, and time.     Deep Tendon Reflexes: Reflexes  are normal and symmetric.  Psychiatric:        Mood and Affect: Mood normal.        Behavior: Behavior normal.        Thought Content: Thought content normal.        Judgment: Judgment normal.     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  Area prepped with alcohol pad and infiltrated with 3 cc 1% lidocaine with epinephrine until adequately numbed. Area then prepped with betadine. Dermablade used to shave lesion flush, and silver nitrate and pressure from gauze applied to control bleeding with good success. Neosporin and bandage applied. Home wound care reviewed with return precautions. Sample sent off to lab in bx specimen cup. Procedure well tolerated with no immediate complications noted.   Results for orders placed or performed in visit on 05/16/19  UA/M w/rflx Culture, Routine   Specimen: Urine   URINE  Result Value Ref Range   Specific Gravity, UA 1.015 1.005 - 1.030   pH, UA 7.0 5.0 - 7.5   Color, UA Yellow Yellow   Appearance Ur Clear Clear   Leukocytes,UA Negative Negative   Protein,UA Negative Negative/Trace   Glucose, UA Negative Negative   Ketones, UA Negative Negative   RBC, UA Negative Negative   Bilirubin, UA Negative Negative   Urobilinogen, Ur 0.2 0.2 - 1.0 mg/dL   Nitrite, UA Negative Negative  Assessment & Plan:   Problem List Items Addressed This Visit    None    Visit Diagnoses    Neoplasm of uncertain behavior    -  Primary   Will refer to Dermatology given high suspicion for [facility] while awaiting bx results and for mgmt of multiple AKs, regular skin checks   Relevant Orders   Ambulatory referral to Dermatology   Dermatology pathology   Annual physical exam       Relevant Orders   CBC with Differential/Platelet   Comprehensive metabolic panel   Lipid Panel w/o Chol/HDL Ratio   UA/M w/rflx Culture, Routine (Completed)   PSA   Dizziness       Orthostatic  vitals not showing major drop with standing, suspect heat and hydration related. Special heat care instructions given, continue close BP monitoring   Immunization due       Relevant Orders   Td vaccine greater than or equal to 7yo preservative free IM (Completed)       Discussed aspirin prophylaxis for myocardial infarction prevention and decision was a baby aspirin was recommended  LABORATORY TESTING:  Health maintenance labs ordered today as discussed above.   The natural history of prostate cancer and ongoing controversy regarding screening and potential treatment outcomes of prostate cancer has been discussed with the patient. The meaning of a false positive PSA and a false negative PSA has been discussed. He indicates understanding of the limitations of this screening test and wishes to proceed with screening PSA testing.   IMMUNIZATIONS:   - Tdap: Tetanus vaccination status reviewed: Td vaccination indicated and given today. - Influenza: Postponed to flu season  SCREENING: - Colonoscopy: Up to date  Discussed with patient purpose of the colonoscopy is to detect colon cancer at curable precancerous or early stages   PATIENT COUNSELING:    Sexuality: Discussed sexually transmitted diseases, partner selection, use of condoms, avoidance of unintended pregnancy  and contraceptive alternatives.   Advised to avoid cigarette smoking.  I discussed with the patient that most people either abstain from alcohol or drink within safe limits (<=14/week and <=4 drinks/occasion for males, <=7/weeks and <= 3 drinks/occasion for females) and that the risk for alcohol disorders and other health effects rises proportionally with the number of drinks per week and how often a drinker exceeds daily limits.  Discussed cessation/primary prevention of drug use and availability of treatment for abuse.   Diet: Encouraged to adjust caloric intake to maintain  or achieve ideal body weight, to reduce intake  of dietary saturated fat and total fat, to limit sodium intake by avoiding high sodium foods and not adding table salt, and to maintain adequate dietary potassium and calcium preferably from fresh fruits, vegetables, and low-fat dairy products.    stressed the importance of regular exercise  Injury prevention: Discussed safety belts, safety helmets, smoke detector, smoking near bedding or upholstery.   Dental health: Discussed importance of regular tooth brushing, flossing, and dental visits.   Follow up plan: NEXT PREVENTATIVE PHYSICAL DUE IN 1 YEAR. Return in about 4 months (around 09/15/2019) for 6 month f/u.

## 2019-05-17 ENCOUNTER — Encounter: Payer: Self-pay | Admitting: Family Medicine

## 2019-05-17 LAB — CBC WITH DIFFERENTIAL/PLATELET
Basophils Absolute: 0.1 10*3/uL (ref 0.0–0.2)
Basos: 1 %
EOS (ABSOLUTE): 0.1 10*3/uL (ref 0.0–0.4)
Eos: 2 %
Hematocrit: 54.5 % — ABNORMAL HIGH (ref 37.5–51.0)
Hemoglobin: 18.4 g/dL — ABNORMAL HIGH (ref 13.0–17.7)
Immature Grans (Abs): 0 10*3/uL (ref 0.0–0.1)
Immature Granulocytes: 0 %
Lymphocytes Absolute: 2 10*3/uL (ref 0.7–3.1)
Lymphs: 27 %
MCH: 31.7 pg (ref 26.6–33.0)
MCHC: 33.8 g/dL (ref 31.5–35.7)
MCV: 94 fL (ref 79–97)
Monocytes Absolute: 0.8 10*3/uL (ref 0.1–0.9)
Monocytes: 11 %
Neutrophils Absolute: 4.3 10*3/uL (ref 1.4–7.0)
Neutrophils: 59 %
Platelets: 215 10*3/uL (ref 150–450)
RBC: 5.8 x10E6/uL (ref 4.14–5.80)
RDW: 13.1 % (ref 11.6–15.4)
WBC: 7.3 10*3/uL (ref 3.4–10.8)

## 2019-05-17 LAB — COMPREHENSIVE METABOLIC PANEL
ALT: 66 IU/L — ABNORMAL HIGH (ref 0–44)
AST: 93 IU/L — ABNORMAL HIGH (ref 0–40)
Albumin/Globulin Ratio: 1.8 (ref 1.2–2.2)
Albumin: 3.9 g/dL (ref 3.8–4.9)
Alkaline Phosphatase: 99 IU/L (ref 39–117)
BUN/Creatinine Ratio: 8 — ABNORMAL LOW (ref 10–24)
BUN: 9 mg/dL (ref 8–27)
Bilirubin Total: 0.9 mg/dL (ref 0.0–1.2)
CO2: 26 mmol/L (ref 20–29)
Calcium: 9.2 mg/dL (ref 8.6–10.2)
Chloride: 99 mmol/L (ref 96–106)
Creatinine, Ser: 1.13 mg/dL (ref 0.76–1.27)
GFR calc Af Amer: 81 mL/min/{1.73_m2} (ref >59)
GFR calc non Af Amer: 70 mL/min/{1.73_m2} (ref >59)
Globulin, Total: 2.2 g/dL (ref 1.5–4.5)
Glucose: 108 mg/dL — ABNORMAL HIGH (ref 65–99)
Potassium: 4.4 mmol/L (ref 3.5–5.2)
Sodium: 140 mmol/L (ref 134–144)
Total Protein: 6.1 g/dL (ref 6.0–8.5)

## 2019-05-17 LAB — LIPID PANEL W/O CHOL/HDL RATIO
Cholesterol, Total: 196 mg/dL (ref 100–199)
HDL: 72 mg/dL (ref >39)
LDL Calculated: 102 mg/dL — ABNORMAL HIGH (ref 0–99)
Triglycerides: 108 mg/dL (ref 0–149)
VLDL Cholesterol Cal: 22 mg/dL (ref 5–40)

## 2019-05-17 LAB — PSA: Prostate Specific Ag, Serum: 5.3 ng/mL — ABNORMAL HIGH (ref 0.0–4.0)

## 2019-05-20 ENCOUNTER — Other Ambulatory Visit: Payer: Self-pay | Admitting: Family Medicine

## 2019-05-20 DIAGNOSIS — R972 Elevated prostate specific antigen [PSA]: Secondary | ICD-10-CM

## 2019-05-20 DIAGNOSIS — R7989 Other specified abnormal findings of blood chemistry: Secondary | ICD-10-CM

## 2019-05-21 ENCOUNTER — Encounter: Payer: Self-pay | Admitting: Family Medicine

## 2019-06-18 ENCOUNTER — Other Ambulatory Visit: Payer: Self-pay

## 2019-06-18 ENCOUNTER — Other Ambulatory Visit: Payer: BC Managed Care – PPO

## 2019-06-18 DIAGNOSIS — R972 Elevated prostate specific antigen [PSA]: Secondary | ICD-10-CM

## 2019-06-18 DIAGNOSIS — R7989 Other specified abnormal findings of blood chemistry: Secondary | ICD-10-CM

## 2019-06-19 LAB — COMPREHENSIVE METABOLIC PANEL
ALT: 70 IU/L — ABNORMAL HIGH (ref 0–44)
AST: 90 IU/L — ABNORMAL HIGH (ref 0–40)
Albumin/Globulin Ratio: 1.6 (ref 1.2–2.2)
Albumin: 3.9 g/dL (ref 3.8–4.9)
Alkaline Phosphatase: 97 IU/L (ref 39–117)
BUN/Creatinine Ratio: 8 — ABNORMAL LOW (ref 10–24)
BUN: 9 mg/dL (ref 8–27)
Bilirubin Total: 1.3 mg/dL — ABNORMAL HIGH (ref 0.0–1.2)
CO2: 30 mmol/L — ABNORMAL HIGH (ref 20–29)
Calcium: 9.1 mg/dL (ref 8.6–10.2)
Chloride: 99 mmol/L (ref 96–106)
Creatinine, Ser: 1.06 mg/dL (ref 0.76–1.27)
GFR calc Af Amer: 88 mL/min/{1.73_m2} (ref >59)
GFR calc non Af Amer: 76 mL/min/{1.73_m2} (ref >59)
Globulin, Total: 2.4 g/dL (ref 1.5–4.5)
Glucose: 113 mg/dL — ABNORMAL HIGH (ref 65–99)
Potassium: 4 mmol/L (ref 3.5–5.2)
Sodium: 140 mmol/L (ref 134–144)
Total Protein: 6.3 g/dL (ref 6.0–8.5)

## 2019-06-19 LAB — PSA: Prostate Specific Ag, Serum: 2.6 ng/mL (ref 0.0–4.0)

## 2019-06-24 ENCOUNTER — Other Ambulatory Visit: Payer: Self-pay | Admitting: Family Medicine

## 2019-06-24 DIAGNOSIS — R7989 Other specified abnormal findings of blood chemistry: Secondary | ICD-10-CM

## 2019-08-19 ENCOUNTER — Encounter: Payer: Self-pay | Admitting: Family Medicine

## 2019-09-26 ENCOUNTER — Ambulatory Visit: Payer: BC Managed Care – PPO | Admitting: Family Medicine

## 2019-09-26 ENCOUNTER — Encounter: Payer: Self-pay | Admitting: Family Medicine

## 2019-09-30 ENCOUNTER — Ambulatory Visit: Payer: BC Managed Care – PPO | Admitting: Family Medicine

## 2019-09-30 ENCOUNTER — Encounter: Payer: Self-pay | Admitting: Family Medicine

## 2019-09-30 ENCOUNTER — Other Ambulatory Visit: Payer: Self-pay

## 2019-09-30 VITALS — BP 133/78 | HR 63 | Temp 98.3°F | Ht 68.5 in | Wt 217.0 lb

## 2019-09-30 DIAGNOSIS — I10 Essential (primary) hypertension: Secondary | ICD-10-CM

## 2019-09-30 DIAGNOSIS — Z23 Encounter for immunization: Secondary | ICD-10-CM

## 2019-09-30 DIAGNOSIS — K219 Gastro-esophageal reflux disease without esophagitis: Secondary | ICD-10-CM | POA: Diagnosis not present

## 2019-09-30 DIAGNOSIS — M1000 Idiopathic gout, unspecified site: Secondary | ICD-10-CM | POA: Diagnosis not present

## 2019-09-30 DIAGNOSIS — R7989 Other specified abnormal findings of blood chemistry: Secondary | ICD-10-CM

## 2019-09-30 DIAGNOSIS — R739 Hyperglycemia, unspecified: Secondary | ICD-10-CM

## 2019-09-30 MED ORDER — DEXILANT 60 MG PO CPDR: 60.0000 mg | DELAYED_RELEASE_CAPSULE | Freq: Every day | ORAL | 1 refills | Status: DC

## 2019-09-30 NOTE — Progress Notes (Signed)
BP 133/78   Pulse 63   Temp 98.3 F (36.8 C) (Oral)   Ht 5' 8.5" (1.74 m)   Wt 217 lb (98.4 kg)   SpO2 97%   BMI 32.51 kg/m    Subjective:    Patient ID: Alexander Mendoza, male    DOB: 1961-08-23, 58 y.o.   MRN: [id number]  HPI: Alexander Mendoza is a 58 y.o. male  Chief Complaint  Patient presents with  . Hypertension   Patient presenting today for chronic condition f/u.   Has never had a gout issue, was placed on allopurinol due to elevated uric acid levels on lab results. Wanting to see about coming off of it.   BPs at home 130s/70s. Taking medication faithfully without side effects. Denies CP, SOB, HAs, dizziness.   GERD - has been followed by GI through [facility], had upper GI. Tried and failed nexium, Prilosec, zegerid, and protonix in the past. Dexilant seems to help the most. Known hiatal hernia and chronic gastritis per GI notes.   Relevant past medical, surgical, family and social history reviewed and updated as indicated. Interim medical history since our last visit reviewed. Allergies and medications reviewed and updated.  Review of Systems  Per HPI unless specifically indicated above     Objective:    BP 133/78   Pulse 63   Temp 98.3 F (36.8 C) (Oral)   Ht 5' 8.5" (1.74 m)   Wt 217 lb (98.4 kg)   SpO2 97%   BMI 32.51 kg/m   Wt Readings from Last 3 Encounters:  09/30/19 217 lb (98.4 kg)  05/16/19 221 lb 9 oz (100.5 kg)  07/20/18 225 lb 6.4 oz (102.2 kg)    Physical Exam Vitals and nursing note reviewed.  Constitutional:      Appearance: Normal appearance.  HENT:     Head: Atraumatic.  Eyes:     Extraocular Movements: Extraocular movements intact.     Conjunctiva/sclera: Conjunctivae normal.  Cardiovascular:     Rate and Rhythm: Normal rate and regular rhythm.  Pulmonary:     Effort: Pulmonary effort is normal.     Breath sounds: Normal breath sounds.  Abdominal:     General: Bowel sounds are normal. There is no distension.     Palpations: Abdomen  is soft.     Tenderness: There is no abdominal tenderness. There is no guarding.  Musculoskeletal:        General: Normal range of motion.     Cervical back: Normal range of motion and neck supple.  Skin:    General: Skin is warm and dry.  Neurological:     General: No focal deficit present.     Mental Status: He is oriented to person, place, and time.  Psychiatric:        Mood and Affect: Mood normal.        Thought Content: Thought content normal.        Judgment: Judgment normal.     Results for orders placed or performed in visit on 09/30/19  Hepatitis panel, acute  Result Value Ref Range   Hep A IgM Negative Negative   Hepatitis B Surface Ag Negative Negative   Hep B C IgM Negative Negative   Hep C Virus Ab <0.1 0.0 - 0.9 s/co ratio  Comprehensive metabolic panel  Result Value Ref Range   Glucose 89 65 - 99 mg/dL   BUN 13 8 - 27 mg/dL   Creatinine, Ser 1.27 0.76 - 1.27  mg/dL   GFR calc non Af Amer 61 >59 mL/min/1.73   GFR calc Af Amer 71 >59 mL/min/1.73   BUN/Creatinine Ratio 10 10 - 24   Sodium 140 134 - 144 mmol/L   Potassium 4.6 3.5 - 5.2 mmol/L   Chloride 99 96 - 106 mmol/L   CO2 25 20 - 29 mmol/L   Calcium 9.8 8.6 - 10.2 mg/dL   Total Protein 6.9 6.0 - 8.5 g/dL   Albumin 4.2 3.8 - 4.9 g/dL   Globulin, Total 2.7 1.5 - 4.5 g/dL   Albumin/Globulin Ratio 1.6 1.2 - 2.2   Bilirubin Total 1.1 0.0 - 1.2 mg/dL   Alkaline Phosphatase 77 39 - 117 IU/L   AST 54 (H) 0 - 40 IU/L   ALT 65 (H) 0 - 44 IU/L  HgB A1c  Result Value Ref Range   Hgb A1c MFr Bld 5.2 4.8 - 5.6 %   Est. average glucose Bld gHb Est-mCnc 103 mg/dL  Uric acid  Result Value Ref Range   Uric Acid 3.7 (L) 3.8 - 8.4 mg/dL      Assessment & Plan:   Problem List Items Addressed This Visit      Cardiovascular and Mediastinum   Hypertension    BPs stable and WNL, continue current regimen        Digestive   GERD (gastroesophageal reflux disease)    Stable and under good control, continue  current regimen      Relevant Medications   dexlansoprazole (DEXILANT) 60 MG capsule     Other   Gout    D/c allopurinol as he's never had a gout flare he is aware of. Monitor closely. Diet changes to lower uric acid reviewed      Relevant Orders   Uric acid (Completed)    Other Visit Diagnoses    Flu vaccine need    -  Primary   Relevant Orders   Flu Vaccine QUAD 6+ mos PF IM (Fluarix Quad PF) (Completed)   Elevated LFTs       Relevant Orders   Comprehensive metabolic panel (Completed)   Elevated blood sugar       Relevant Orders   HgB A1c (Completed)       Follow up plan: Return in about 6 months (around 03/30/2020) for CPE.

## 2019-10-01 LAB — COMPREHENSIVE METABOLIC PANEL
ALT: 65 IU/L — ABNORMAL HIGH (ref 0–44)
AST: 54 IU/L — ABNORMAL HIGH (ref 0–40)
Albumin/Globulin Ratio: 1.6 (ref 1.2–2.2)
Albumin: 4.2 g/dL (ref 3.8–4.9)
Alkaline Phosphatase: 77 IU/L (ref 39–117)
BUN/Creatinine Ratio: 10 (ref 10–24)
BUN: 13 mg/dL (ref 8–27)
Bilirubin Total: 1.1 mg/dL (ref 0.0–1.2)
CO2: 25 mmol/L (ref 20–29)
Calcium: 9.8 mg/dL (ref 8.6–10.2)
Chloride: 99 mmol/L (ref 96–106)
Creatinine, Ser: 1.27 mg/dL (ref 0.76–1.27)
GFR calc Af Amer: 71 mL/min/{1.73_m2} (ref >59)
GFR calc non Af Amer: 61 mL/min/{1.73_m2} (ref >59)
Globulin, Total: 2.7 g/dL (ref 1.5–4.5)
Glucose: 89 mg/dL (ref 65–99)
Potassium: 4.6 mmol/L (ref 3.5–5.2)
Sodium: 140 mmol/L (ref 134–144)
Total Protein: 6.9 g/dL (ref 6.0–8.5)

## 2019-10-01 LAB — HEPATITIS PANEL, ACUTE
Hep A IgM: NEGATIVE
Hep B C IgM: NEGATIVE
Hep C Virus Ab: <0.1 s/co ratio (ref 0.0–0.9)
Hepatitis B Surface Ag: NEGATIVE

## 2019-10-01 LAB — URIC ACID: Uric Acid: 3.7 mg/dL — ABNORMAL LOW (ref 3.8–8.4)

## 2019-10-01 LAB — HEMOGLOBIN A1C
Est. average glucose Bld gHb Est-mCnc: 103 mg/dL
Hgb A1c MFr Bld: 5.2 % (ref 4.8–5.6)

## 2019-10-10 NOTE — Assessment & Plan Note (Signed)
Stable and under good control, continue current regimen 

## 2019-10-10 NOTE — Assessment & Plan Note (Signed)
D/c allopurinol as he's never had a gout flare he is aware of. Monitor closely. Diet changes to lower uric acid reviewed

## 2019-10-10 NOTE — Assessment & Plan Note (Signed)
BPs stable and WNL, continue current regimen 

## 2019-12-03 ENCOUNTER — Other Ambulatory Visit: Payer: Self-pay

## 2019-12-03 DIAGNOSIS — I10 Essential (primary) hypertension: Secondary | ICD-10-CM

## 2019-12-03 NOTE — Telephone Encounter (Signed)
Patient has a CPE with you in June

## 2019-12-04 MED ORDER — AMLODIPINE BESYLATE 5 MG PO TABS: 5.0000 mg | ORAL_TABLET | Freq: Every day | ORAL | 1 refills | Status: DC

## 2020-01-09 ENCOUNTER — Other Ambulatory Visit: Payer: Self-pay

## 2020-01-09 DIAGNOSIS — I10 Essential (primary) hypertension: Secondary | ICD-10-CM

## 2020-01-09 MED ORDER — LOSARTAN POTASSIUM 100 MG PO TABS: 100.0000 mg | ORAL_TABLET | Freq: Every day | ORAL | 0 refills | Status: DC

## 2020-01-09 NOTE — Telephone Encounter (Addendum)
Refill request for Losartan LOV: 09/30/19 Next Appt: 04/01/20  Request also for Allopurinol. But I do not see it in his current medication list.

## 2020-02-05 ENCOUNTER — Telehealth (INDEPENDENT_AMBULATORY_CARE_PROVIDER_SITE_OTHER): Payer: BC Managed Care – PPO | Admitting: Family Medicine

## 2020-02-05 ENCOUNTER — Encounter: Payer: Self-pay | Admitting: Family Medicine

## 2020-02-05 DIAGNOSIS — J3089 Other allergic rhinitis: Secondary | ICD-10-CM

## 2020-02-05 DIAGNOSIS — J309 Allergic rhinitis, unspecified: Secondary | ICD-10-CM | POA: Insufficient documentation

## 2020-02-05 MED ORDER — DOXYCYCLINE HYCLATE 100 MG PO TABS: 100.0000 mg | ORAL_TABLET | Freq: Two times a day (BID) | ORAL | 0 refills | Status: DC

## 2020-02-05 MED ORDER — FLUTICASONE PROPIONATE 50 MCG/ACT NA SUSP: 2.0000 | Freq: Every day | NASAL | 12 refills | Status: DC

## 2020-02-05 MED ORDER — PREDNISONE 50 MG PO TABS: ORAL_TABLET | ORAL | 0 refills | Status: DC

## 2020-02-05 NOTE — Progress Notes (Signed)
There were no vitals taken for this visit.   Subjective:    Patient ID: Alexander Mendoza, male    DOB: 09-13-1961, 59 y.o.   MRN: [id number]  HPI: Alexander Mendoza is a 59 y.o. male  Chief Complaint  Patient presents with  . Nasal Congestion  . clogged ears   Presenting today with progressive sxs the past 3 weeks of sinus pressure and pain, nasal congestion, and now ear pressure worst on the left. Denies fever, chills, cough, CP, SOB. Has been compliant with allergy regimen, taking mucinex, and nasal rinses without relief. Denies sick contacts, recent travels.   Relevant past medical, surgical, family and social history reviewed and updated as indicated. Interim medical history since our last visit reviewed. Allergies and medications reviewed and updated.  Review of Systems  Per HPI unless specifically indicated above     Objective:    There were no vitals taken for this visit.  Wt Readings from Last 3 Encounters:  09/30/19 217 lb (98.4 kg)  05/16/19 221 lb 9 oz (100.5 kg)  07/20/18 225 lb 6.4 oz (102.2 kg)    Physical Exam Vitals and nursing note reviewed.  Constitutional:      General: He is not in acute distress.    Appearance: Normal appearance.  HENT:     Head: Atraumatic.     Right Ear: External ear normal.     Left Ear: External ear normal.     Nose: Congestion present.     Mouth/Throat:     Mouth: Mucous membranes are moist.     Pharynx: Oropharynx is clear. Posterior oropharyngeal erythema present.  Eyes:     Extraocular Movements: Extraocular movements intact.     Conjunctiva/sclera: Conjunctivae normal.  Pulmonary:     Effort: Pulmonary effort is normal. No respiratory distress.  Musculoskeletal:        General: Normal range of motion.     Cervical back: Normal range of motion.  Skin:    General: Skin is dry.     Findings: No erythema or rash.  Neurological:     Mental Status: He is oriented to person, place, and time.  Psychiatric:        Mood and  Affect: Mood normal.        Thought Content: Thought content normal.        Judgment: Judgment normal.     Results for orders placed or performed in visit on 09/30/19  Hepatitis panel, acute  Result Value Ref Range   Hep A IgM Negative Negative   Hepatitis B Surface Ag Negative Negative   Hep B C IgM Negative Negative   Hep C Virus Ab <0.1 0.0 - 0.9 s/co ratio  Comprehensive metabolic panel  Result Value Ref Range   Glucose 89 65 - 99 mg/dL   BUN 13 8 - 27 mg/dL   Creatinine, Ser 1.27 0.76 - 1.27 mg/dL   GFR calc non Af Amer 61 >59 mL/min/1.73   GFR calc Af Amer 71 >59 mL/min/1.73   BUN/Creatinine Ratio 10 10 - 24   Sodium 140 134 - 144 mmol/L   Potassium 4.6 3.5 - 5.2 mmol/L   Chloride 99 96 - 106 mmol/L   CO2 25 20 - 29 mmol/L   Calcium 9.8 8.6 - 10.2 mg/dL   Total Protein 6.9 6.0 - 8.5 g/dL   Albumin 4.2 3.8 - 4.9 g/dL   Globulin, Total 2.7 1.5 - 4.5 g/dL   Albumin/Globulin Ratio 1.6 1.2 -  2.2   Bilirubin Total 1.1 0.0 - 1.2 mg/dL   Alkaline Phosphatase 77 39 - 117 IU/L   AST 54 (H) 0 - 40 IU/L   ALT 65 (H) 0 - 44 IU/L  HgB A1c  Result Value Ref Range   Hgb A1c MFr Bld 5.2 4.8 - 5.6 %   Est. average glucose Bld gHb Est-mCnc 103 mg/dL  Uric acid  Result Value Ref Range   Uric Acid 3.7 (L) 3.8 - 8.4 mg/dL      Assessment & Plan:   Problem List Items Addressed This Visit      Respiratory   Allergic rhinitis    Unclear whether current illness began as viral or allergic, but given duration and worsening will tx with short prednisone burst and doxycycline. Continue good allergy regimen daily and f/u if not improving          Follow up plan: Return if symptoms worsen or fail to improve.

## 2020-02-05 NOTE — Assessment & Plan Note (Signed)
Unclear whether current illness began as viral or allergic, but given duration and worsening will tx with short prednisone burst and doxycycline. Continue good allergy regimen daily and f/u if not improving

## 2020-02-11 ENCOUNTER — Telehealth: Payer: Self-pay | Admitting: Family Medicine

## 2020-02-11 ENCOUNTER — Encounter: Payer: Self-pay | Admitting: Family Medicine

## 2020-02-11 NOTE — Telephone Encounter (Signed)
predniSONE (DELTASONE) 50 MG tablet    Patient states that he feels this medication is not working for him as he is still experiencing fullness in the ears and headache.

## 2020-02-12 ENCOUNTER — Encounter: Payer: Self-pay | Admitting: Family Medicine

## 2020-02-12 ENCOUNTER — Ambulatory Visit: Payer: BC Managed Care – PPO | Admitting: Family Medicine

## 2020-02-12 ENCOUNTER — Other Ambulatory Visit: Payer: Self-pay

## 2020-02-12 VITALS — BP 167/98 | HR 55 | Temp 98.3°F | Wt 216.6 lb

## 2020-02-12 DIAGNOSIS — J3089 Other allergic rhinitis: Secondary | ICD-10-CM | POA: Diagnosis not present

## 2020-02-12 DIAGNOSIS — H9203 Otalgia, bilateral: Secondary | ICD-10-CM | POA: Diagnosis not present

## 2020-02-12 MED ORDER — PREDNISONE 10 MG PO TABS: ORAL_TABLET | ORAL | 0 refills | Status: DC

## 2020-02-12 MED ORDER — LEVOCETIRIZINE DIHYDROCHLORIDE 5 MG PO TABS: 5.0000 mg | ORAL_TABLET | Freq: Every evening | ORAL | 3 refills | Status: DC

## 2020-02-12 NOTE — Telephone Encounter (Signed)
Pt seen in clinic this afternoon.

## 2020-02-17 NOTE — Progress Notes (Signed)
BP (!) 167/98 (BP Location: Left Arm, Patient Position: Sitting, Cuff Size: Normal)   Pulse (!) 55   Temp 98.3 F (36.8 C) (Oral)   Wt 216 lb 9.6 oz (98.2 kg)   SpO2 99%   BMI 32.45 kg/m    Subjective:    Patient ID: Alexander Mendoza, male    DOB: 09-05-1961, 59 y.o.   MRN: [id number]  HPI: Alexander Mendoza is a 59 y.o. male  Chief Complaint  Patient presents with  . Ear Problem    feels clogged, intermitent,    Sinus congestion improved with doxycycline and prednisone burst but ears still feel clogged and pressurized/painful (left greater than right). Takes nasal sprays but does not take any antihistamines for allergic rhinitis. Denies fever, chills, drainage, but does have muffled hearing.   Relevant past medical, surgical, family and social history reviewed and updated as indicated. Interim medical history since our last visit reviewed. Allergies and medications reviewed and updated.  Review of Systems  Per HPI unless specifically indicated above     Objective:    BP (!) 167/98 (BP Location: Left Arm, Patient Position: Sitting, Cuff Size: Normal)   Pulse (!) 55   Temp 98.3 F (36.8 C) (Oral)   Wt 216 lb 9.6 oz (98.2 kg)   SpO2 99%   BMI 32.45 kg/m   Wt Readings from Last 3 Encounters:  02/12/20 216 lb 9.6 oz (98.2 kg)  09/30/19 217 lb (98.4 kg)  05/16/19 221 lb 9 oz (100.5 kg)    Physical Exam Vitals and nursing note reviewed.  Constitutional:      Appearance: Normal appearance.  HENT:     Head: Atraumatic.     Ears:     Comments: B/l TMs with mild effusion and injected TM, non erythematous, no purulence Eyes:     Extraocular Movements: Extraocular movements intact.     Conjunctiva/sclera: Conjunctivae normal.  Cardiovascular:     Rate and Rhythm: Normal rate and regular rhythm.  Pulmonary:     Effort: Pulmonary effort is normal.     Breath sounds: Normal breath sounds.  Musculoskeletal:        General: Normal range of motion.     Cervical back: Normal  range of motion and neck supple.  Skin:    General: Skin is warm and dry.  Neurological:     General: No focal deficit present.     Mental Status: He is oriented to person, place, and time.  Psychiatric:        Mood and Affect: Mood normal.        Thought Content: Thought content normal.        Judgment: Judgment normal.     Results for orders placed or performed in visit on 09/30/19  Hepatitis panel, acute  Result Value Ref Range   Hep A IgM Negative Negative   Hepatitis B Surface Ag Negative Negative   Hep B C IgM Negative Negative   Hep C Virus Ab <0.1 0.0 - 0.9 s/co ratio  Comprehensive metabolic panel  Result Value Ref Range   Glucose 89 65 - 99 mg/dL   BUN 13 8 - 27 mg/dL   Creatinine, Ser 1.27 0.76 - 1.27 mg/dL   GFR calc non Af Amer 61 >59 mL/min/1.73   GFR calc Af Amer 71 >59 mL/min/1.73   BUN/Creatinine Ratio 10 10 - 24   Sodium 140 134 - 144 mmol/L   Potassium 4.6 3.5 - 5.2 mmol/L   Chloride  99 96 - 106 mmol/L   CO2 25 20 - 29 mmol/L   Calcium 9.8 8.6 - 10.2 mg/dL   Total Protein 6.9 6.0 - 8.5 g/dL   Albumin 4.2 3.8 - 4.9 g/dL   Globulin, Total 2.7 1.5 - 4.5 g/dL   Albumin/Globulin Ratio 1.6 1.2 - 2.2   Bilirubin Total 1.1 0.0 - 1.2 mg/dL   Alkaline Phosphatase 77 39 - 117 IU/L   AST 54 (H) 0 - 40 IU/L   ALT 65 (H) 0 - 44 IU/L  HgB A1c  Result Value Ref Range   Hgb A1c MFr Bld 5.2 4.8 - 5.6 %   Est. average glucose Bld gHb Est-mCnc 103 mg/dL  Uric acid  Result Value Ref Range   Uric Acid 3.7 (L) 3.8 - 8.4 mg/dL      Assessment & Plan:   Problem List Items Addressed This Visit      Respiratory   Allergic rhinitis - Primary    Add daily antihistamine, start prednisone taper and continue nasal sprays. May need rare prn sudafed to help with sxs (with close monitoring of BP).        Other Visit Diagnoses    Otalgia of both ears           Follow up plan: Return if symptoms worsen or fail to improve.

## 2020-02-17 NOTE — Assessment & Plan Note (Signed)
Add daily antihistamine, start prednisone taper and continue nasal sprays. May need rare prn sudafed to help with sxs (with close monitoring of BP).

## 2020-02-27 ENCOUNTER — Encounter: Payer: Self-pay | Admitting: Family Medicine

## 2020-03-01 ENCOUNTER — Other Ambulatory Visit: Payer: Self-pay | Admitting: Family Medicine

## 2020-03-01 DIAGNOSIS — H9202 Otalgia, left ear: Secondary | ICD-10-CM

## 2020-03-08 ENCOUNTER — Encounter: Payer: Self-pay | Admitting: Family Medicine

## 2020-03-23 ENCOUNTER — Telehealth: Payer: Self-pay | Admitting: Family Medicine

## 2020-03-23 NOTE — Telephone Encounter (Signed)
FYI scheduled him in person appt for tomorrow 03/24/20 with Apolonio Schneiders  Copied from [place] [phone]. Topic: General - Other >> Mar 23, 2020 12:00 PM Leward Quan A wrote: Reason for CRM: Patient called to speak to Merrie Roof or her nurse to request an Rx. Per patient he hurt his back over the weekend while out of town and was seen and also informed to contact his Dr to get a muscle relaxer or Prednisome to help relieve his pain. Please call pat. At Ph# [phone]

## 2020-03-24 ENCOUNTER — Ambulatory Visit (INDEPENDENT_AMBULATORY_CARE_PROVIDER_SITE_OTHER): Payer: BC Managed Care – PPO | Admitting: Family Medicine

## 2020-03-24 ENCOUNTER — Other Ambulatory Visit: Payer: Self-pay

## 2020-03-24 ENCOUNTER — Encounter: Payer: Self-pay | Admitting: Family Medicine

## 2020-03-24 VITALS — BP 162/78 | HR 57 | Temp 98.2°F | Ht 68.0 in | Wt 208.0 lb

## 2020-03-24 DIAGNOSIS — M545 Low back pain, unspecified: Secondary | ICD-10-CM

## 2020-03-24 MED ORDER — CYCLOBENZAPRINE HCL 10 MG PO TABS
10.0000 mg | ORAL_TABLET | Freq: Three times a day (TID) | ORAL | 0 refills | Status: DC | PRN
Start: 2020-03-24 — End: 2022-10-12

## 2020-03-24 MED ORDER — PREDNISONE 10 MG PO TABS
ORAL_TABLET | ORAL | 0 refills | Status: DC
Start: 2020-03-24 — End: 2020-04-01

## 2020-03-24 NOTE — Progress Notes (Signed)
BP (!) 162/78 (BP Location: Left Arm, Patient Position: Sitting, Cuff Size: Normal)   Pulse (!) 57   Temp 98.2 F (36.8 C) (Oral)   Ht 5\' 8"  (1.727 m)   Wt 208 lb (94.3 kg)   SpO2 98%   BMI 31.63 kg/m    Subjective:    Patient ID: Alexander Mendoza, male    DOB: 1961/03/15, 59 y.o.   MRN: [id number]  HPI: Alexander Mendoza is a 59 y.o. male  Chief Complaint  Patient presents with  . Back Pain    Chiropracter advised patient to see provider. Ongoing appx 6 days. Patient states his back "tensed up" and hasn't subsided.   4-5 days of low back pain and spasms, left lateral the worst. States he ws stooped over helping a toddler walk for about 20 min and felt things come on during the tail end of that activity. Has had this happen in the past to him, usually able to work things out without needing medications but things are not easing up this time. No numbness or tingling down legs, bowel or bladder incontinence, fever, chills, dysuria, hematuria. Seeing Chiropractor and doing tens units, acupuncture, dry needling, and heat without relief.   Relevant past medical, surgical, family and social history reviewed and updated as indicated. Interim medical history since our last visit reviewed. Allergies and medications reviewed and updated.  Review of Systems  Per HPI unless specifically indicated above     Objective:    BP (!) 162/78 (BP Location: Left Arm, Patient Position: Sitting, Cuff Size: Normal)   Pulse (!) 57   Temp 98.2 F (36.8 C) (Oral)   Ht 5\' 8"  (1.727 m)   Wt 208 lb (94.3 kg)   SpO2 98%   BMI 31.63 kg/m   Wt Readings from Last 3 Encounters:  03/24/20 208 lb (94.3 kg)  02/12/20 216 lb 9.6 oz (98.2 kg)  09/30/19 217 lb (98.4 kg)    Physical Exam Vitals and nursing note reviewed.  Constitutional:      Appearance: Normal appearance.  HENT:     Head: Atraumatic.  Eyes:     Extraocular Movements: Extraocular movements intact.     Conjunctiva/sclera: Conjunctivae  normal.  Cardiovascular:     Rate and Rhythm: Normal rate and regular rhythm.  Pulmonary:     Effort: Pulmonary effort is normal.     Breath sounds: Normal breath sounds.  Abdominal:     General: Bowel sounds are normal. There is no distension.     Palpations: Abdomen is soft.     Tenderness: There is no abdominal tenderness. There is no guarding.  Musculoskeletal:        General: Tenderness (b/l low back ttp) present. No deformity. Normal range of motion.     Cervical back: Normal range of motion and neck supple.  Skin:    General: Skin is warm and dry.  Neurological:     General: No focal deficit present.     Mental Status: He is oriented to person, place, and time.     Sensory: No sensory deficit.     Motor: No weakness.     Gait: Gait normal.  Psychiatric:        Mood and Affect: Mood normal.        Thought Content: Thought content normal.        Judgment: Judgment normal.     Results for orders placed or performed in visit on 09/30/19  Hepatitis panel, acute  Result Value Ref Range   Hep A IgM Negative Negative   Hepatitis B Surface Ag Negative Negative   Hep B C IgM Negative Negative   Hep C Virus Ab <0.1 0.0 - 0.9 s/co ratio  Comprehensive metabolic panel  Result Value Ref Range   Glucose 89 65 - 99 mg/dL   BUN 13 8 - 27 mg/dL   Creatinine, Ser 1.27 0.76 - 1.27 mg/dL   GFR calc non Af Amer 61 >59 mL/min/1.73   GFR calc Af Amer 71 >59 mL/min/1.73   BUN/Creatinine Ratio 10 10 - 24   Sodium 140 134 - 144 mmol/L   Potassium 4.6 3.5 - 5.2 mmol/L   Chloride 99 96 - 106 mmol/L   CO2 25 20 - 29 mmol/L   Calcium 9.8 8.6 - 10.2 mg/dL   Total Protein 6.9 6.0 - 8.5 g/dL   Albumin 4.2 3.8 - 4.9 g/dL   Globulin, Total 2.7 1.5 - 4.5 g/dL   Albumin/Globulin Ratio 1.6 1.2 - 2.2   Bilirubin Total 1.1 0.0 - 1.2 mg/dL   Alkaline Phosphatase 77 39 - 117 IU/L   AST 54 (H) 0 - 40 IU/L   ALT 65 (H) 0 - 44 IU/L  HgB A1c  Result Value Ref Range   Hgb A1c MFr Bld 5.2 4.8 - 5.6 %    Est. average glucose Bld gHb Est-mCnc 103 mg/dL  Uric acid  Result Value Ref Range   Uric Acid 3.7 (L) 3.8 - 8.4 mg/dL      Assessment & Plan:   Problem List Items Addressed This Visit    None    Visit Diagnoses    Acute bilateral low back pain without sciatica    -  Primary   Tx wtih prednisone taper, flexeril, continued stretches and interventions through Chiropractor. F/u if worsening or not improving   Relevant Medications   cyclobenzaprine (FLEXERIL) 10 MG tablet   predniSONE (DELTASONE) 10 MG tablet       Follow up plan: Return if symptoms worsen or fail to improve.

## 2020-04-01 ENCOUNTER — Encounter: Payer: Self-pay | Admitting: Family Medicine

## 2020-04-01 ENCOUNTER — Ambulatory Visit (INDEPENDENT_AMBULATORY_CARE_PROVIDER_SITE_OTHER): Payer: BC Managed Care – PPO | Admitting: Family Medicine

## 2020-04-01 ENCOUNTER — Other Ambulatory Visit: Payer: Self-pay

## 2020-04-01 VITALS — BP 132/84 | HR 69 | Temp 98.3°F | Wt 219.0 lb

## 2020-04-01 DIAGNOSIS — J3089 Other allergic rhinitis: Secondary | ICD-10-CM

## 2020-04-01 DIAGNOSIS — I10 Essential (primary) hypertension: Secondary | ICD-10-CM | POA: Diagnosis not present

## 2020-04-01 DIAGNOSIS — R4781 Slurred speech: Secondary | ICD-10-CM

## 2020-04-01 DIAGNOSIS — K219 Gastro-esophageal reflux disease without esophagitis: Secondary | ICD-10-CM

## 2020-04-01 DIAGNOSIS — R413 Other amnesia: Secondary | ICD-10-CM

## 2020-04-01 DIAGNOSIS — M1000 Idiopathic gout, unspecified site: Secondary | ICD-10-CM

## 2020-04-01 DIAGNOSIS — R4182 Altered mental status, unspecified: Secondary | ICD-10-CM

## 2020-04-01 DIAGNOSIS — N529 Male erectile dysfunction, unspecified: Secondary | ICD-10-CM | POA: Insufficient documentation

## 2020-04-01 MED ORDER — AMLODIPINE BESYLATE 5 MG PO TABS: 5.0000 mg | ORAL_TABLET | Freq: Every day | ORAL | 1 refills | Status: DC

## 2020-04-01 MED ORDER — DEXILANT 60 MG PO CPDR: 60.0000 mg | DELAYED_RELEASE_CAPSULE | Freq: Every day | ORAL | 1 refills | Status: DC

## 2020-04-01 MED ORDER — LOSARTAN POTASSIUM 100 MG PO TABS: 100.0000 mg | ORAL_TABLET | Freq: Every day | ORAL | 1 refills | Status: DC

## 2020-04-01 MED ORDER — TADALAFIL 10 MG PO TABS
10.0000 mg | ORAL_TABLET | Freq: Every day | ORAL | 5 refills | Status: AC | PRN
Start: 2020-04-01 — End: ?

## 2020-04-01 NOTE — Assessment & Plan Note (Signed)
BPs stable and well controlled, continue current regimen 

## 2020-04-01 NOTE — Assessment & Plan Note (Signed)
Stable, under good control. Continue current regimen 

## 2020-04-01 NOTE — Assessment & Plan Note (Signed)
Recheck uric acid, per patient still has never had a gout flare even off the allopurinol. Continue off and monitoring

## 2020-04-01 NOTE — Assessment & Plan Note (Signed)
Requested increase in cialis given typically through Urology, rx sent with precautions

## 2020-04-01 NOTE — Progress Notes (Signed)
BP 132/84   Pulse 69   Temp 98.3 F (36.8 C) (Oral)   Wt 219 lb (99.3 kg)   SpO2 99%   BMI 33.30 kg/m    Subjective:    Patient ID: Alexander Mendoza, male    DOB: 1961/02/06, 59 y.o.   MRN: [id number]  HPI: Alexander Mendoza is a 59 y.o. male  Chief Complaint  Patient presents with  . Hypertension   Here today for 6 month f/u chronic conditions.   Wife is with him today and wants to discuss some very concerning issues that have been ongoing for 12-18 months and worsening. Altered mental status, fatigue, intermittent memory changes, balance and coordination issues, agitation, name calling, disorientation, repeating himself, slurred speech. Wife states it's almost like he's drunk at times though he hasn't been drinking. This seems to worsen after strenuous days. There are times when he is at baseline, such as this morning. Does not tend to notice on his own when he gets this way. Denies head injury, new medications, new illnesses, hx of neurologic issues.   HTN - Home readings running 130s/70s typically. Taking medication faithfully without issue. Denies side effects. Trying to eat well and stay active.   GERD - Stable without breakthrough sxs on dexilant, tolerating well.  Allergies - no recent exacerbations on current regimen though does still have pressure and ringing in ears here and there.  Gout - was taken off allopurinol 6 months ago because he's never had a gout flare before and was wanting to reduce pill burden. No issues since being off. Due for recheck labs.   Depression screen [facility] 2/9 05/16/2019 11/22/2017 05/22/2017  Decreased Interest 0 0 0  Down, Depressed, Hopeless 0 0 0  PHQ - 2 Score 0 0 0  Altered sleeping - 1 -  Tired, decreased energy - 0 -  Change in appetite - 0 -  Feeling bad or failure about yourself  - 0 -  Trouble concentrating - 0 -  Moving slowly or fidgety/restless - 0 -  Suicidal thoughts - 0 -  PHQ-9 Score - 1 -  No flowsheet data found.  Relevant  past medical, surgical, family and social history reviewed and updated as indicated. Interim medical history since our last visit reviewed. Allergies and medications reviewed and updated.  Review of Systems  Per HPI unless specifically indicated above     Objective:    BP 132/84   Pulse 69   Temp 98.3 F (36.8 C) (Oral)   Wt 219 lb (99.3 kg)   SpO2 99%   BMI 33.30 kg/m   Wt Readings from Last 3 Encounters:  04/01/20 219 lb (99.3 kg)  03/24/20 208 lb (94.3 kg)  02/12/20 216 lb 9.6 oz (98.2 kg)    Physical Exam Vitals and nursing note reviewed.  Constitutional:      Appearance: Normal appearance.  HENT:     Head: Atraumatic.  Eyes:     Extraocular Movements: Extraocular movements intact.     Conjunctiva/sclera: Conjunctivae normal.  Cardiovascular:     Rate and Rhythm: Normal rate and regular rhythm.  Pulmonary:     Effort: Pulmonary effort is normal.     Breath sounds: Normal breath sounds.  Musculoskeletal:        General: Normal range of motion.     Cervical back: Normal range of motion and neck supple.  Skin:    General: Skin is warm and dry.  Neurological:     General: No  focal deficit present.     Mental Status: He is oriented to person, place, and time. Mental status is at baseline.  Psychiatric:        Mood and Affect: Mood normal.        Thought Content: Thought content normal.        Judgment: Judgment normal.     Results for orders placed or performed in visit on 09/30/19  Hepatitis panel, acute  Result Value Ref Range   Hep A IgM Negative Negative   Hepatitis B Surface Ag Negative Negative   Hep B C IgM Negative Negative   Hep C Virus Ab <0.1 0.0 - 0.9 s/co ratio  Comprehensive metabolic panel  Result Value Ref Range   Glucose 89 65 - 99 mg/dL   BUN 13 8 - 27 mg/dL   Creatinine, Ser 1.27 0.76 - 1.27 mg/dL   GFR calc non Af Amer 61 >59 mL/min/1.73   GFR calc Af Amer 71 >59 mL/min/1.73   BUN/Creatinine Ratio 10 10 - 24   Sodium 140 134 - 144  mmol/L   Potassium 4.6 3.5 - 5.2 mmol/L   Chloride 99 96 - 106 mmol/L   CO2 25 20 - 29 mmol/L   Calcium 9.8 8.6 - 10.2 mg/dL   Total Protein 6.9 6.0 - 8.5 g/dL   Albumin 4.2 3.8 - 4.9 g/dL   Globulin, Total 2.7 1.5 - 4.5 g/dL   Albumin/Globulin Ratio 1.6 1.2 - 2.2   Bilirubin Total 1.1 0.0 - 1.2 mg/dL   Alkaline Phosphatase 77 39 - 117 IU/L   AST 54 (H) 0 - 40 IU/L   ALT 65 (H) 0 - 44 IU/L  HgB A1c  Result Value Ref Range   Hgb A1c MFr Bld 5.2 4.8 - 5.6 %   Est. average glucose Bld gHb Est-mCnc 103 mg/dL  Uric acid  Result Value Ref Range   Uric Acid 3.7 (L) 3.8 - 8.4 mg/dL      Assessment & Plan:   Problem List Items Addressed This Visit      Cardiovascular and Mediastinum   Hypertension - Primary    BPs stable and well controlled, continue current regimen      Relevant Medications   losartan (COZAAR) 100 MG tablet   amLODipine (NORVASC) 5 MG tablet   tadalafil (CIALIS) 10 MG tablet   Other Relevant Orders   Comprehensive metabolic panel     Respiratory   Allergic rhinitis    Stable, under good control. Continue current regimen        Digestive   GERD (gastroesophageal reflux disease)    Under good control, continue dexilant and diet modifications      Relevant Medications   dexlansoprazole (DEXILANT) 60 MG capsule     Other   Gout    Recheck uric acid, per patient still has never had a gout flare even off the allopurinol. Continue off and monitoring      Relevant Orders   Uric acid   ED (erectile dysfunction)    Requested increase in cialis given typically through Urology, rx sent with precautions       Other Visit Diagnoses    Altered mental status, unspecified altered mental status type       Relevant Orders   Ambulatory referral to Neurology   Memory change       Significant concern for Neurologic pathology given persistent sxs, urgent Neurology referral placed for further workup   Relevant Orders   Ambulatory referral  to Neurology    Slurred speech       Relevant Orders   Ambulatory referral to Neurology      45 minutes spent today in direct patient care, counseling, and coordination of care.   Follow up plan: Return in about 6 months (around 10/01/2020) for CPE, 6 month f/u.

## 2020-04-01 NOTE — Assessment & Plan Note (Signed)
Under good control, continue dexilant and diet modifications

## 2020-04-02 DIAGNOSIS — R4189 Other symptoms and signs involving cognitive functions and awareness: Secondary | ICD-10-CM | POA: Insufficient documentation

## 2020-04-02 LAB — COMPREHENSIVE METABOLIC PANEL
ALT: 42 IU/L (ref 0–44)
AST: 44 IU/L — ABNORMAL HIGH (ref 0–40)
Albumin/Globulin Ratio: 1.7 (ref 1.2–2.2)
Albumin: 4.1 g/dL (ref 3.8–4.8)
Alkaline Phosphatase: 81 IU/L (ref 48–121)
BUN/Creatinine Ratio: 14 (ref 10–24)
BUN: 16 mg/dL (ref 8–27)
Bilirubin Total: 0.7 mg/dL (ref 0.0–1.2)
CO2: 28 mmol/L (ref 20–29)
Calcium: 9.1 mg/dL (ref 8.6–10.2)
Chloride: 98 mmol/L (ref 96–106)
Creatinine, Ser: 1.14 mg/dL (ref 0.76–1.27)
GFR calc Af Amer: 80 mL/min/{1.73_m2} (ref >59)
GFR calc non Af Amer: 69 mL/min/{1.73_m2} (ref >59)
Globulin, Total: 2.4 g/dL (ref 1.5–4.5)
Glucose: 92 mg/dL (ref 65–99)
Potassium: 4.1 mmol/L (ref 3.5–5.2)
Sodium: 139 mmol/L (ref 134–144)
Total Protein: 6.5 g/dL (ref 6.0–8.5)

## 2020-04-02 LAB — URIC ACID: Uric Acid: 7 mg/dL (ref 3.8–8.4)

## 2020-04-06 ENCOUNTER — Other Ambulatory Visit: Payer: Self-pay | Admitting: Acute Care

## 2020-04-06 DIAGNOSIS — I639 Cerebral infarction, unspecified: Secondary | ICD-10-CM

## 2020-04-21 ENCOUNTER — Other Ambulatory Visit: Payer: Self-pay

## 2020-04-21 ENCOUNTER — Ambulatory Visit
Admission: RE | Admit: 2020-04-21 | Discharge: 2020-04-21 | Disposition: A | Discharge status: Home / Self Care | Payer: BC Managed Care – PPO | Source: Ambulatory Visit | Attending: Acute Care | Admitting: Acute Care

## 2020-04-21 DIAGNOSIS — I639 Cerebral infarction, unspecified: Secondary | ICD-10-CM | POA: Diagnosis present

## 2020-05-07 DIAGNOSIS — R569 Unspecified convulsions: Secondary | ICD-10-CM | POA: Insufficient documentation

## 2020-05-30 DIAGNOSIS — R519 Headache, unspecified: Secondary | ICD-10-CM | POA: Insufficient documentation

## 2020-05-30 HISTORY — DX: Headache, unspecified: R51.9

## 2020-06-05 ENCOUNTER — Encounter: Payer: Self-pay | Admitting: Family Medicine

## 2020-06-05 ENCOUNTER — Other Ambulatory Visit: Payer: Self-pay

## 2020-06-05 ENCOUNTER — Ambulatory Visit (INDEPENDENT_AMBULATORY_CARE_PROVIDER_SITE_OTHER): Payer: BC Managed Care – PPO | Admitting: Family Medicine

## 2020-06-05 VITALS — BP 138/85 | HR 77 | Temp 98.0°F | Ht 68.0 in | Wt 215.0 lb

## 2020-06-05 DIAGNOSIS — J3089 Other allergic rhinitis: Secondary | ICD-10-CM | POA: Diagnosis not present

## 2020-06-05 DIAGNOSIS — M1000 Idiopathic gout, unspecified site: Secondary | ICD-10-CM

## 2020-06-05 DIAGNOSIS — Z125 Encounter for screening for malignant neoplasm of prostate: Secondary | ICD-10-CM

## 2020-06-05 DIAGNOSIS — E559 Vitamin D deficiency, unspecified: Secondary | ICD-10-CM

## 2020-06-05 DIAGNOSIS — K219 Gastro-esophageal reflux disease without esophagitis: Secondary | ICD-10-CM

## 2020-06-05 DIAGNOSIS — I10 Essential (primary) hypertension: Secondary | ICD-10-CM

## 2020-06-05 DIAGNOSIS — N529 Male erectile dysfunction, unspecified: Secondary | ICD-10-CM

## 2020-06-05 DIAGNOSIS — Z0000 Encounter for general adult medical examination without abnormal findings: Secondary | ICD-10-CM | POA: Diagnosis not present

## 2020-06-05 DIAGNOSIS — E538 Deficiency of other specified B group vitamins: Secondary | ICD-10-CM

## 2020-06-05 DIAGNOSIS — Z136 Encounter for screening for cardiovascular disorders: Secondary | ICD-10-CM

## 2020-06-05 LAB — UA/M W/RFLX CULTURE, ROUTINE
Bilirubin, UA: NEGATIVE
Glucose, UA: NEGATIVE
Ketones, UA: NEGATIVE
Leukocytes,UA: NEGATIVE
Nitrite, UA: NEGATIVE
Protein,UA: NEGATIVE
RBC, UA: NEGATIVE
Specific Gravity, UA: 1.015 (ref 1.005–1.030)
Urobilinogen, Ur: 0.2 mg/dL (ref 0.2–1.0)
pH, UA: 5.5 (ref 5.0–7.5)

## 2020-06-05 NOTE — Progress Notes (Signed)
BP 138/85 (BP Location: Left Arm, Patient Position: Sitting, Cuff Size: Normal)   Pulse 77   Temp 98 F (36.7 C) (Oral)   Ht 5\' 8"  (1.727 m)   Wt 215 lb (97.5 kg)   SpO2 98%   BMI 32.69 kg/m    Subjective:    Patient ID: Alexander Mendoza, male    DOB: [birth year], 59 y.o.   MRN: [id number]  HPI: KYNDAL GLOSTER is a 59 y.o. male presenting on 06/05/2020 for comprehensive medical examination. Current medical complaints include:none  Tolerating medicines well, taking faithfully, denies side effects.  He currently lives with: Interim Problems from his last visit: no  Depression Screen done today and results listed below:  Depression screen [facility] 2/9 05/16/2019 11/22/2017 05/22/2017 11/15/2016  Decreased Interest 0 0 0 0  Down, Depressed, Hopeless 0 0 0 0  PHQ - 2 Score 0 0 0 0  Altered sleeping - 1 - -  Tired, decreased energy - 0 - -  Change in appetite - 0 - -  Feeling bad or failure about yourself  - 0 - -  Trouble concentrating - 0 - -  Moving slowly or fidgety/restless - 0 - -  Suicidal thoughts - 0 - -  PHQ-9 Score - 1 - -    The patient does not have a history of falls. I did complete a risk assessment for falls. A plan of care for falls was documented.   Past Medical History:  Past Medical History:  Diagnosis Date  . Allergy   . Chronic kidney disease   . GERD (gastroesophageal reflux disease)   . Hyperlipidemia   . Hypertension     Surgical History:  Past Surgical History:  Procedure Laterality Date  . VASECTOMY      Medications:  Current Outpatient Medications on File Prior to Visit  Medication Sig  . amLODipine (NORVASC) 5 MG tablet Take 1 tablet (5 mg total) by mouth daily.  Marland Kitchen azelastine (ASTELIN) 0.1 % nasal spray Place 1 spray into both nostrils 2 (two) times daily.  . clonazePAM (KLONOPIN) 0.5 MG tablet Take 0.25 mg by mouth 2 (two) times daily.  . cyanocobalamin 1000 MCG tablet Take by mouth.  . dexlansoprazole (DEXILANT) 60 MG capsule Take 1 capsule  (60 mg total) by mouth daily.  . fluticasone (FLONASE) 50 MCG/ACT nasal spray Place 2 sprays into both nostrils daily.  Marland Kitchen JATENZO 237 MG CAPS Take 1 capsule by mouth 2 (two) times daily.  Marland Kitchen levocetirizine (XYZAL) 5 MG tablet Take 1 tablet (5 mg total) by mouth every evening.  Marland Kitchen losartan (COZAAR) 100 MG tablet Take 1 tablet (100 mg total) by mouth daily.  . Magnesium Citrate 100 MG TABS Take by mouth.  . Multiple Vitamin (MULTI-VITAMINS) TABS Take by mouth daily.  . nortriptyline (PAMELOR) 10 MG capsule Take 10 mg by mouth at bedtime.  . Omega-3 Fatty Acids (FISH OIL) 1200 MG CAPS Take 2,400 mg by mouth daily.  . tadalafil (CIALIS) 10 MG tablet Take 1 tablet (10 mg total) by mouth daily as needed for erectile dysfunction.  . vitamin E 400 UNIT capsule Take 800 Units by mouth daily.  Marland Kitchen albuterol (PROVENTIL HFA;VENTOLIN HFA) 108 (90 Base) MCG/ACT inhaler Inhale 1 puff into the lungs every 6 (six) hours as needed for wheezing or shortness of breath. (Patient not taking: Reported on 04/01/2020)  . cyclobenzaprine (FLEXERIL) 10 MG tablet Take 1 tablet (10 mg total) by mouth 3 (three) times daily as needed for  muscle spasms. (Patient not taking: Reported on 06/05/2020)   No current facility-administered medications on file prior to visit.    Allergies:  Allergies  Allergen Reactions  . Benazepril Cough    Social History:  Social History   Socioeconomic History  . Marital status: Married    Spouse name: Not on file  . Number of children: Not on file  . Years of education: Not on file  . Highest education level: Not on file  Occupational History  . Not on file  Tobacco Use  . Smoking status: Never Smoker  . Smokeless tobacco: Current User    Types: Snuff  Vaping Use  . Vaping Use: Never used  Substance and Sexual Activity  . Alcohol use: Yes  . Drug use: No  . Sexual activity: Not on file  Other Topics Concern  . Not on file  Social History Narrative  . Not on file   Social  Determinants of Health   Financial Resource Strain:   . Difficulty of Paying Living Expenses: Not on file  Food Insecurity:   . Worried About Charity fundraiser in the Last Year: Not on file  . Ran Out of Food in the Last Year: Not on file  Transportation Needs:   . Lack of Transportation (Medical): Not on file  . Lack of Transportation (Non-Medical): Not on file  Physical Activity:   . Days of Exercise per Week: Not on file  . Minutes of Exercise per Session: Not on file  Stress:   . Feeling of Stress : Not on file  Social Connections:   . Frequency of Communication with Friends and Family: Not on file  . Frequency of Social Gatherings with Friends and Family: Not on file  . Attends Religious Services: Not on file  . Active Member of Clubs or Organizations: Not on file  . Attends Archivist Meetings: Not on file  . Marital Status: Not on file  Intimate Partner Violence:   . Fear of Current or Ex-Partner: Not on file  . Emotionally Abused: Not on file  . Physically Abused: Not on file  . Sexually Abused: Not on file   Social History   Tobacco Use  Smoking Status Never Smoker  Smokeless Tobacco Current User  . Types: Snuff   Social History   Substance and Sexual Activity  Alcohol Use Yes    Family History:  Family History  Problem Relation Age of Onset  . Cancer Mother        Breast  . Hypertension Father   . Cancer Brother        Testicular    Past medical history, surgical history, medications, allergies, family history and social history reviewed with patient today and changes made to appropriate areas of the chart.   Review of Systems - General ROS: negative Psychological ROS: negative Ophthalmic ROS: negative ENT ROS: negative Allergy and Immunology ROS: negative Hematological and Lymphatic ROS: negative Endocrine ROS: negative Breast ROS: negative for breast lumps Respiratory ROS: no cough, shortness of breath, or wheezing Cardiovascular  ROS: no chest pain or dyspnea on exertion Gastrointestinal ROS: no abdominal pain, change in bowel habits, or black or bloody stools Genito-Urinary ROS: no dysuria, trouble voiding, or hematuria Musculoskeletal ROS: negative Neurological ROS: no TIA or stroke symptoms Dermatological ROS: negative All other ROS negative except what is listed above and in the HPI.      Objective:    BP 138/85 (BP Location: Left Arm, Patient Position:  Sitting, Cuff Size: Normal)   Pulse 77   Temp 98 F (36.7 C) (Oral)   Ht 5\' 8"  (1.727 m)   Wt 215 lb (97.5 kg)   SpO2 98%   BMI 32.69 kg/m   Wt Readings from Last 3 Encounters:  06/05/20 215 lb (97.5 kg)  04/01/20 219 lb (99.3 kg)  03/24/20 208 lb (94.3 kg)    Physical Exam Vitals and nursing note reviewed.  Constitutional:      General: He is not in acute distress.    Appearance: He is well-developed.  HENT:     Head: Atraumatic.     Right Ear: Tympanic membrane and external ear normal.     Left Ear: Tympanic membrane and external ear normal.     Nose: Nose normal.     Mouth/Throat:     Mouth: Mucous membranes are moist.     Pharynx: Oropharynx is clear.  Eyes:     General: No scleral icterus.    Conjunctiva/sclera: Conjunctivae normal.     Pupils: Pupils are equal, round, and reactive to light.  Cardiovascular:     Rate and Rhythm: Normal rate and regular rhythm.     Heart sounds: Normal heart sounds. No murmur heard.   Pulmonary:     Effort: Pulmonary effort is normal. No respiratory distress.     Breath sounds: Normal breath sounds.  Abdominal:     General: Bowel sounds are normal. There is no distension.     Palpations: Abdomen is soft. There is no mass.     Tenderness: There is no abdominal tenderness. There is no guarding.  Genitourinary:    Comments: Declines GU exam Musculoskeletal:        General: No tenderness. Normal range of motion.     Cervical back: Normal range of motion and neck supple.  Skin:    General: Skin is  warm and dry.     Findings: No rash.  Neurological:     General: No focal deficit present.     Mental Status: He is alert and oriented to person, place, and time.     Deep Tendon Reflexes: Reflexes are normal and symmetric.  Psychiatric:        Mood and Affect: Mood normal.        Behavior: Behavior normal.        Thought Content: Thought content normal.        Judgment: Judgment normal.     Results for orders placed or performed in visit on 04/01/20  Comprehensive metabolic panel  Result Value Ref Range   Glucose 92 65 - 99 mg/dL   BUN 16 8 - 27 mg/dL   Creatinine, Ser 1.14 0.76 - 1.27 mg/dL   GFR calc non Af Amer 69 >59 mL/min/1.73   GFR calc Af Amer 80 >59 mL/min/1.73   BUN/Creatinine Ratio 14 10 - 24   Sodium 139 134 - 144 mmol/L   Potassium 4.1 3.5 - 5.2 mmol/L   Chloride 98 96 - 106 mmol/L   CO2 28 20 - 29 mmol/L   Calcium 9.1 8.6 - 10.2 mg/dL   Total Protein 6.5 6.0 - 8.5 g/dL   Albumin 4.1 3.8 - 4.8 g/dL   Globulin, Total 2.4 1.5 - 4.5 g/dL   Albumin/Globulin Ratio 1.7 1.2 - 2.2   Bilirubin Total 0.7 0.0 - 1.2 mg/dL   Alkaline Phosphatase 81 48 - 121 IU/L   AST 44 (H) 0 - 40 IU/L   ALT 42 0 -  44 IU/L  Uric acid  Result Value Ref Range   Uric Acid 7.0 3.8 - 8.4 mg/dL      Assessment & Plan:   Problem List Items Addressed This Visit      Cardiovascular and Mediastinum   Hypertension   Relevant Orders   CBC with Differential/Platelet   Comprehensive metabolic panel   UA/M w/rflx Culture, Routine     Respiratory   Allergic rhinitis     Digestive   GERD (gastroesophageal reflux disease)     Other   Gout   Relevant Orders   Uric acid   ED (erectile dysfunction)   Vitamin B12 deficiency   Relevant Orders   Vitamin B12   Vitamin D deficiency   Relevant Orders   VITAMIN D 25 Hydroxy (Vit-D Deficiency, Fractures)    Other Visit Diagnoses    Annual physical exam    -  Primary   Chronic conditions stable, continue present medications   Screening  for cardiovascular condition       Relevant Orders   Lipid Panel w/o Chol/HDL Ratio   Screening for prostate cancer       Relevant Orders   PSA       Discussed aspirin prophylaxis for myocardial infarction prevention and decision was made to continue ASA  LABORATORY TESTING:  Health maintenance labs ordered today as discussed above.   The natural history of prostate cancer and ongoing controversy regarding screening and potential treatment outcomes of prostate cancer has been discussed with the patient. The meaning of a false positive PSA and a false negative PSA has been discussed. He indicates understanding of the limitations of this screening test and wishes to proceed with screening PSA testing.   IMMUNIZATIONS:   - Tdap: Tetanus vaccination status reviewed: last tetanus booster within 10 years. - Influenza: Postponed to flu season  SCREENING: - Colonoscopy: Up to date  Discussed with patient purpose of the colonoscopy is to detect colon cancer at curable precancerous or early stages   PATIENT COUNSELING:    Sexuality: Discussed sexually transmitted diseases, partner selection, use of condoms, avoidance of unintended pregnancy  and contraceptive alternatives.   Advised to avoid cigarette smoking.  I discussed with the patient that most people either abstain from alcohol or drink within safe limits (<=14/week and <=4 drinks/occasion for males, <=7/weeks and <= 3 drinks/occasion for females) and that the risk for alcohol disorders and other health effects rises proportionally with the number of drinks per week and how often a drinker exceeds daily limits.  Discussed cessation/primary prevention of drug use and availability of treatment for abuse.   Diet: Encouraged to adjust caloric intake to maintain  or achieve ideal body weight, to reduce intake of dietary saturated fat and total fat, to limit sodium intake by avoiding high sodium foods and not adding table salt, and to  maintain adequate dietary potassium and calcium preferably from fresh fruits, vegetables, and low-fat dairy products.    stressed the importance of regular exercise  Injury prevention: Discussed safety belts, safety helmets, smoke detector, smoking near bedding or upholstery.   Dental health: Discussed importance of regular tooth brushing, flossing, and dental visits.   Follow up plan: NEXT PREVENTATIVE PHYSICAL DUE IN 1 YEAR. Return in about 6 months (around 12/06/2020) for 6 month f/u.

## 2020-06-06 LAB — COMPREHENSIVE METABOLIC PANEL
ALT: 50 IU/L — ABNORMAL HIGH (ref 0–44)
AST: 46 IU/L — ABNORMAL HIGH (ref 0–40)
Albumin/Globulin Ratio: 1.5 (ref 1.2–2.2)
Albumin: 4.1 g/dL (ref 3.8–4.8)
Alkaline Phosphatase: 96 IU/L (ref 48–121)
BUN/Creatinine Ratio: 6 — ABNORMAL LOW (ref 10–24)
BUN: 6 mg/dL — ABNORMAL LOW (ref 8–27)
Bilirubin Total: 0.5 mg/dL (ref 0.0–1.2)
CO2: 27 mmol/L (ref 20–29)
Calcium: 9 mg/dL (ref 8.6–10.2)
Chloride: 102 mmol/L (ref 96–106)
Creatinine, Ser: 1.04 mg/dL (ref 0.76–1.27)
GFR calc Af Amer: 89 mL/min/{1.73_m2} (ref >59)
GFR calc non Af Amer: 77 mL/min/{1.73_m2} (ref >59)
Globulin, Total: 2.8 g/dL (ref 1.5–4.5)
Glucose: 105 mg/dL — ABNORMAL HIGH (ref 65–99)
Potassium: 3.7 mmol/L (ref 3.5–5.2)
Sodium: 142 mmol/L (ref 134–144)
Total Protein: 6.9 g/dL (ref 6.0–8.5)

## 2020-06-06 LAB — CBC WITH DIFFERENTIAL/PLATELET
Basophils Absolute: 0.1 10*3/uL (ref 0.0–0.2)
Basos: 1 %
EOS (ABSOLUTE): 0.3 10*3/uL (ref 0.0–0.4)
Eos: 5 %
Hematocrit: 53.4 % — ABNORMAL HIGH (ref 37.5–51.0)
Hemoglobin: 18.3 g/dL — ABNORMAL HIGH (ref 13.0–17.7)
Immature Grans (Abs): 0 10*3/uL (ref 0.0–0.1)
Immature Granulocytes: 1 %
Lymphocytes Absolute: 1.3 10*3/uL (ref 0.7–3.1)
Lymphs: 25 %
MCH: 32.8 pg (ref 26.6–33.0)
MCHC: 34.3 g/dL (ref 31.5–35.7)
MCV: 96 fL (ref 79–97)
Monocytes Absolute: 0.6 10*3/uL (ref 0.1–0.9)
Monocytes: 11 %
Neutrophils Absolute: 3 10*3/uL (ref 1.4–7.0)
Neutrophils: 57 %
Platelets: 244 10*3/uL (ref 150–450)
RBC: 5.58 x10E6/uL (ref 4.14–5.80)
RDW: 13.2 % (ref 11.6–15.4)
WBC: 5.3 10*3/uL (ref 3.4–10.8)

## 2020-06-06 LAB — LIPID PANEL W/O CHOL/HDL RATIO
Cholesterol, Total: 191 mg/dL (ref 100–199)
HDL: 50 mg/dL (ref >39)
LDL Chol Calc (NIH): 92 mg/dL (ref 0–99)
Triglycerides: 297 mg/dL — ABNORMAL HIGH (ref 0–149)
VLDL Cholesterol Cal: 49 mg/dL — ABNORMAL HIGH (ref 5–40)

## 2020-06-06 LAB — VITAMIN D 25 HYDROXY (VIT D DEFICIENCY, FRACTURES): Vit D, 25-Hydroxy: 37.3 ng/mL (ref 30.0–100.0)

## 2020-06-06 LAB — URIC ACID: Uric Acid: 7.4 mg/dL (ref 3.8–8.4)

## 2020-06-06 LAB — VITAMIN B12: Vitamin B-12: 1299 pg/mL — ABNORMAL HIGH (ref 232–1245)

## 2020-06-06 LAB — PSA: Prostate Specific Ag, Serum: 2.5 ng/mL (ref 0.0–4.0)

## 2020-06-08 NOTE — Progress Notes (Signed)
Contacted via MyChart  Good evening Alexander Mendoza, labs have returned: - Continue to show some mild elevation in hemoglobin and hematocrit we will continue to monitor and B12 level is above goal.  If taking B12 maybe cut back to 500 MCG daily. - Vitamin D, thyroid, prostate all normal - Liver function tests are mildly elevated, will continue to monitor these and recommend minimal use alcohol or Tylenol. Everything else looks good.  Continue your current medication regimen.  IF any questions let us know.:)

## 2020-06-13 ENCOUNTER — Emergency Department: Payer: BC Managed Care – PPO

## 2020-06-13 ENCOUNTER — Observation Stay
Admission: EM | Admit: 2020-06-13 | Discharge: 2020-06-14 | Disposition: A | Discharge status: Home / Self Care | Payer: BC Managed Care – PPO | Attending: Hospitalist | Admitting: Hospitalist

## 2020-06-13 DIAGNOSIS — Z79899 Other long term (current) drug therapy: Secondary | ICD-10-CM | POA: Insufficient documentation

## 2020-06-13 DIAGNOSIS — I10 Essential (primary) hypertension: Secondary | ICD-10-CM | POA: Diagnosis present

## 2020-06-13 DIAGNOSIS — Z20822 Contact with and (suspected) exposure to covid-19: Secondary | ICD-10-CM | POA: Diagnosis not present

## 2020-06-13 DIAGNOSIS — F10129 Alcohol abuse with intoxication, unspecified: Secondary | ICD-10-CM | POA: Diagnosis present

## 2020-06-13 DIAGNOSIS — K219 Gastro-esophageal reflux disease without esophagitis: Secondary | ICD-10-CM | POA: Diagnosis not present

## 2020-06-13 DIAGNOSIS — K7290 Hepatic failure, unspecified without coma: Principal | ICD-10-CM | POA: Insufficient documentation

## 2020-06-13 DIAGNOSIS — K7682 Hepatic encephalopathy: Secondary | ICD-10-CM | POA: Diagnosis present

## 2020-06-13 DIAGNOSIS — F10120 Alcohol abuse with intoxication, uncomplicated: Secondary | ICD-10-CM | POA: Diagnosis not present

## 2020-06-13 DIAGNOSIS — R413 Other amnesia: Secondary | ICD-10-CM

## 2020-06-13 DIAGNOSIS — K760 Fatty (change of) liver, not elsewhere classified: Secondary | ICD-10-CM | POA: Diagnosis not present

## 2020-06-13 DIAGNOSIS — R410 Disorientation, unspecified: Secondary | ICD-10-CM

## 2020-06-13 DIAGNOSIS — R4182 Altered mental status, unspecified: Secondary | ICD-10-CM | POA: Diagnosis present

## 2020-06-13 LAB — CBC WITH DIFFERENTIAL/PLATELET
Abs Immature Granulocytes: 0.04 10*3/uL (ref 0.00–0.07)
Basophils Absolute: 0.1 10*3/uL (ref 0.0–0.1)
Basophils Relative: 1 %
Eosinophils Absolute: 0.3 10*3/uL (ref 0.0–0.5)
Eosinophils Relative: 4 %
HCT: 53.4 % — ABNORMAL HIGH (ref 39.0–52.0)
Hemoglobin: 17.6 g/dL — ABNORMAL HIGH (ref 13.0–17.0)
Immature Granulocytes: 1 %
Lymphocytes Relative: 27 %
Lymphs Abs: 2.4 10*3/uL (ref 0.7–4.0)
MCH: 32.4 pg (ref 26.0–34.0)
MCHC: 33 g/dL (ref 30.0–36.0)
MCV: 98.3 fL (ref 80.0–100.0)
Monocytes Absolute: 0.8 10*3/uL (ref 0.1–1.0)
Monocytes Relative: 9 %
Neutro Abs: 5.1 10*3/uL (ref 1.7–7.7)
Neutrophils Relative %: 58 %
Platelets: 225 10*3/uL (ref 150–400)
RBC: 5.43 MIL/uL (ref 4.22–5.81)
RDW: 14.1 % (ref 11.5–15.5)
WBC: 8.7 10*3/uL (ref 4.0–10.5)
nRBC: 0 % (ref 0.0–0.2)

## 2020-06-13 LAB — COMPREHENSIVE METABOLIC PANEL
ALT: 36 U/L (ref 0–44)
AST: 42 U/L — ABNORMAL HIGH (ref 15–41)
Albumin: 3.4 g/dL — ABNORMAL LOW (ref 3.5–5.0)
Alkaline Phosphatase: 84 U/L (ref 38–126)
Anion gap: 13 (ref 5–15)
BUN: 6 mg/dL — ABNORMAL LOW (ref 8–23)
CO2: 27 mmol/L (ref 22–32)
Calcium: 8.3 mg/dL — ABNORMAL LOW (ref 8.9–10.3)
Chloride: 105 mmol/L (ref 98–111)
Creatinine, Ser: 1.13 mg/dL (ref 0.61–1.24)
GFR calc Af Amer: >60 mL/min (ref >60)
GFR calc non Af Amer: >60 mL/min (ref >60)
Glucose, Bld: 116 mg/dL — ABNORMAL HIGH (ref 70–99)
Potassium: 3.6 mmol/L (ref 3.5–5.1)
Sodium: 145 mmol/L (ref 135–145)
Total Bilirubin: 0.8 mg/dL (ref 0.3–1.2)
Total Protein: 6.8 g/dL (ref 6.5–8.1)

## 2020-06-13 LAB — SALICYLATE LEVEL: Salicylate Lvl: <7 mg/dL — ABNORMAL LOW (ref 7.0–30.0)

## 2020-06-13 LAB — ETHANOL: Alcohol, Ethyl (B): 313 mg/dL (ref <10)

## 2020-06-13 LAB — CBC
HCT: 52.3 % — ABNORMAL HIGH (ref 39.0–52.0)
Hemoglobin: 17.5 g/dL — ABNORMAL HIGH (ref 13.0–17.0)
MCH: 32.8 pg (ref 26.0–34.0)
MCHC: 33.5 g/dL (ref 30.0–36.0)
MCV: 98.1 fL (ref 80.0–100.0)
Platelets: 240 10*3/uL (ref 150–400)
RBC: 5.33 MIL/uL (ref 4.22–5.81)
RDW: 14.1 % (ref 11.5–15.5)
WBC: 7.3 10*3/uL (ref 4.0–10.5)
nRBC: 0 % (ref 0.0–0.2)

## 2020-06-13 LAB — AMMONIA: Ammonia: 112 umol/L — ABNORMAL HIGH (ref 9–35)

## 2020-06-13 LAB — TROPONIN I (HIGH SENSITIVITY): Troponin I (High Sensitivity): 7 ng/L (ref <18)

## 2020-06-13 LAB — ACETAMINOPHEN LEVEL: Acetaminophen (Tylenol), Serum: <10 ug/mL — ABNORMAL LOW (ref 10–30)

## 2020-06-13 MED ORDER — FOLIC ACID 1 MG PO TABS
1.0000 mg | ORAL_TABLET | Freq: Every day | ORAL | Status: DC
  Administered 2020-06-14: 1 mg via ORAL
  Filled 2020-06-13: qty 1

## 2020-06-13 MED ORDER — ONDANSETRON HCL 4 MG PO TABS: 4.0000 mg | ORAL_TABLET | Freq: Four times a day (QID) | ORAL | Status: DC | PRN

## 2020-06-13 MED ORDER — ACETAMINOPHEN 325 MG PO TABS: 650.0000 mg | ORAL_TABLET | Freq: Once | ORAL | Status: DC

## 2020-06-13 MED ORDER — LORAZEPAM 1 MG PO TABS: 1.0000 mg | ORAL_TABLET | ORAL | Status: DC | PRN

## 2020-06-13 MED ORDER — THIAMINE HCL 100 MG PO TABS
100.0000 mg | ORAL_TABLET | Freq: Every day | ORAL | Status: DC
  Administered 2020-06-14: 100 mg via ORAL
  Filled 2020-06-13: qty 1

## 2020-06-13 MED ORDER — ONDANSETRON HCL 4 MG/2ML IJ SOLN: 4.0000 mg | Freq: Four times a day (QID) | INTRAMUSCULAR | Status: DC | PRN

## 2020-06-13 MED ORDER — ADULT MULTIVITAMIN W/MINERALS CH
1.0000 | ORAL_TABLET | Freq: Every day | ORAL | Status: DC
  Administered 2020-06-14: 1 via ORAL
  Filled 2020-06-13: qty 1

## 2020-06-13 MED ORDER — THIAMINE HCL 100 MG/ML IJ SOLN
500.0000 mg | Freq: Once | INTRAMUSCULAR | Status: AC
  Administered 2020-06-13: 500 mg via INTRAMUSCULAR
  Filled 2020-06-13: qty 6

## 2020-06-13 MED ORDER — PANTOPRAZOLE SODIUM 40 MG PO TBEC
40.0000 mg | DELAYED_RELEASE_TABLET | Freq: Every day | ORAL | Status: DC
  Administered 2020-06-14: 40 mg via ORAL
  Filled 2020-06-13: qty 1

## 2020-06-13 MED ORDER — NORTRIPTYLINE HCL 10 MG PO CAPS
10.0000 mg | ORAL_CAPSULE | Freq: Every day | ORAL | Status: DC
  Administered 2020-06-13: 10 mg via ORAL
  Filled 2020-06-13 (×2): qty 1

## 2020-06-13 MED ORDER — LORAZEPAM 2 MG/ML IJ SOLN: 1.0000 mg | INTRAMUSCULAR | Status: DC | PRN

## 2020-06-13 MED ORDER — SODIUM CHLORIDE 0.9% FLUSH: 3.0000 mL | Freq: Two times a day (BID) | INTRAVENOUS | Status: DC

## 2020-06-13 MED ORDER — SODIUM CHLORIDE 0.9 % IV SOLN: INTRAVENOUS | Status: AC

## 2020-06-13 MED ORDER — LOSARTAN POTASSIUM 50 MG PO TABS
100.0000 mg | ORAL_TABLET | Freq: Every day | ORAL | Status: DC
  Administered 2020-06-14: 100 mg via ORAL
  Filled 2020-06-13: qty 2

## 2020-06-13 MED ORDER — THIAMINE HCL 100 MG/ML IJ SOLN: 100.0000 mg | Freq: Every day | INTRAMUSCULAR | Status: DC

## 2020-06-13 MED ORDER — LACTULOSE 10 GM/15ML PO SOLN
30.0000 g | Freq: Three times a day (TID) | ORAL | Status: DC
  Administered 2020-06-14 (×2): 30 g via ORAL
  Filled 2020-06-13 (×2): qty 60

## 2020-06-13 NOTE — ED Provider Notes (Signed)
[facility] Emergency Department Provider Note ____________________________________________   First MD Initiated Contact with Patient 06/13/20 2038     (approximate)  I have reviewed the triage vital signs and the nursing notes.  HISTORY  Chief Complaint Altered Mental Status   HPI LILLIAN TIGGES is a 59 y.o. malewho presents to the ED for evaluation of altered mental status.   Chart review indicates hx HTN, CKD, HLD, GERD and anxiety. Neurology consultation recently, last seen on 05/27/2020, for cognitive and behavioral changes/decline.  Extended in office EEG on 7/28 is normal, 7/17 EEG normal, 7/14 MRI brain without contrast with minimal chronic microvascular ischemic changes, otherwise unremarkable.    Patient presents to the ED with his wife with concerns for progressively worsening mental status at home.  Wife indicates patient is "sharp" every morning, but has progressively worsening mentation throughout the day and sundown's every evening.  She reports agitation, confusion, disorientation and occasionally aggression but this is only in the evenings.  They report concurrent short-term memory loss.  Wife reports leave the house for a couple hours this evening to run some errands, and when she returned home she found the patient with his eyes "glazed over" and patient poorly responsive.  After waking him up, wife reports that he was extraordinarily confused.  She reports this was similar to what she has been noticing for the past few months, but this was the most severe episode.  She was concerned that the neurologists evaluated the patient think that she is crazy because all of their morning visits demonstrate the patient with his improved mental status and then never see him confused at night.  Did this, she present to the ED for evaluation of her husband and to properly document that he does have cognitive decline throughout the day and apparently  sundown's.  Patient and wife report that the patient has had no recent fevers, illnesses, vomiting, chest pain, abdominal pain, syncope, trauma.   When discussing recreational drugs, husband minimizes his drinking indicating he may have had 1 ounce of vodka this evening.  He reports frequently dipping tobacco as his only "crutch."  Denies additional recreational drugs.  Past Medical History:  Diagnosis Date  . Allergy   . Chronic kidney disease   . GERD (gastroesophageal reflux disease)   . Hyperlipidemia   . Hypertension     Patient Active Problem List   Diagnosis Date Noted  . Hepatic encephalopathy ([place]) 06/13/2020  . Vitamin B12 deficiency 06/05/2020  . Vitamin D deficiency 06/05/2020  . ED (erectile dysfunction) 04/01/2020  . Allergic rhinitis 02/05/2020  . Palpitations 06/13/2018  . Hypertension 04/09/2015  . GERD (gastroesophageal reflux disease) 04/09/2015  . Gout 04/09/2015    Past Surgical History:  Procedure Laterality Date  . VASECTOMY      Prior to Admission medications   Medication Sig Start Date End Date Taking? Authorizing Provider  albuterol (PROVENTIL HFA;VENTOLIN HFA) 108 (90 Base) MCG/ACT inhaler Inhale 1 puff into the lungs every 6 (six) hours as needed for wheezing or shortness of breath. 04/20/17  Yes Volney American, PA-C  amLODipine (NORVASC) 5 MG tablet Take 1 tablet (5 mg total) by mouth daily. 04/01/20  Yes Volney American, PA-C  azelastine (ASTELIN) 0.1 % nasal spray Place 1 spray into both nostrils 2 (two) times daily. 11/22/17  Yes Crissman, Jeannette How, MD  clonazePAM (KLONOPIN) 0.5 MG tablet Take 0.25 mg by mouth 2 (two) times daily. 05/27/20  Yes [provider]  cyanocobalamin 1000 MCG tablet Take 1,000 mcg by mouth daily.    Yes [provider]  cyclobenzaprine (FLEXERIL) 10 MG tablet Take 1 tablet (10 mg total) by mouth 3 (three) times daily as needed for muscle spasms. 03/24/20  Yes Volney American, PA-C   dexlansoprazole (DEXILANT) 60 MG capsule Take 1 capsule (60 mg total) by mouth daily. Patient taking differently: Take 60 mg by mouth at bedtime.  04/01/20  Yes Volney American, PA-C  fluticasone [facility]) 50 MCG/ACT nasal spray Place 2 sprays into both nostrils daily. 02/05/20  Yes Volney American, PA-C  JATENZO 237 MG CAPS Take 1 capsule by mouth 2 (two) times daily. 09/13/19  Yes [provider]  levocetirizine (XYZAL) 5 MG tablet Take 1 tablet (5 mg total) by mouth every evening. 02/12/20  Yes Volney American, PA-C  losartan (COZAAR) 100 MG tablet Take 1 tablet (100 mg total) by mouth daily. 04/01/20  Yes Volney American, PA-C  Magnesium Citrate 100 MG TABS Take 100 mg by mouth daily.    Yes [provider]  Multiple Vitamin (MULTI-VITAMINS) TABS Take by mouth daily.   Yes [provider]  nortriptyline (PAMELOR) 10 MG capsule Take 10 mg by mouth at bedtime. 05/27/20  Yes [provider]  Omega-3 Fatty Acids (FISH OIL) 1200 MG CAPS Take 2,400 mg by mouth daily.   Yes [provider]  tadalafil (CIALIS) 10 MG tablet Take 1 tablet (10 mg total) by mouth daily as needed for erectile dysfunction. 04/01/20  Yes Volney American, PA-C  Vitamin D, Cholecalciferol, 25 MCG (1000 UT) TABS Take 1,000 Units by mouth daily.   Yes [provider]  vitamin E 400 UNIT capsule Take 800 Units by mouth daily.   Yes [provider]    Allergies Benazepril  Family History  Problem Relation Age of Onset  . Cancer Mother        Breast  . Hypertension Father   . Cancer Brother        Testicular    Social History Social History   Tobacco Use  . Smoking status: Never Smoker  . Smokeless tobacco: Current User    Types: Snuff  Vaping Use  . Vaping Use: Never used  Substance Use Topics  . Alcohol use: Yes  . Drug use: No    Review of Systems  Constitutional: No fever/chills Eyes: No visual changes. ENT: No  sore throat. Cardiovascular: Denies chest pain. Respiratory: Denies shortness of breath. Gastrointestinal: No abdominal pain.  No nausea, no vomiting.  No diarrhea.  No constipation. Genitourinary: Negative for dysuria. Musculoskeletal: Negative for back pain. Skin: Negative for rash. Neurological: Negative for headaches, focal weakness or numbness.  Positive for cognitive decline and short-term memory loss.   ____________________________________________   PHYSICAL EXAM:  VITAL SIGNS: Vitals:   06/13/20 2112 06/13/20 2230  BP: 139/81   Pulse: 87   Resp: 19 12  Temp:    SpO2: 96%       Constitutional: Alert and oriented. Well appearing and in no acute distress.  Patient is well-appearing, sitting up in bed and in no acute distress.  Follows commands in all 4 extremities.  Answers questions appropriately, though questions that do appropriate short-term memory and more complex questions, he has to ask his wife to answer them for him as he reports forgetting. Eyes: Conjunctivae are normal. PERRL. EOMI. Head: Atraumatic. Nose: No congestion/rhinnorhea. Mouth/Throat: Mucous membranes are moist.  Oropharynx non-erythematous. Neck: No stridor. No  cervical spine tenderness to palpation. Cardiovascular: Normal rate, regular rhythm. Grossly normal heart sounds.  Good peripheral circulation. Respiratory: Normal respiratory effort.  No retractions. Lungs CTAB. Gastrointestinal: Soft , nondistended, nontender to palpation. No abdominal bruits. No CVA tenderness. Musculoskeletal: No lower extremity tenderness nor edema.  No joint effusions. No signs of acute trauma. Neurologic:  Normal speech and language. No gross focal neurologic deficits are appreciated. No gait instability noted. Cranial nerves II through XII intact 5/5 strength and sensation in all 4 extremities Patient able to stand from the stretcher independently and ambulate with a shuffling gait. Skin:  Skin is warm, dry and  intact. No rash noted. Psychiatric: Mood and affect are normal. Speech and behavior are normal.  ____________________________________________   LABS (all labs ordered are listed, but only abnormal results are displayed)  Labs Reviewed  CBC WITH DIFFERENTIAL/PLATELET - Abnormal; Notable for the following components:      Result Value   Hemoglobin 17.6 (*)    HCT 53.4 (*)    All other components within normal limits  COMPREHENSIVE METABOLIC PANEL - Abnormal; Notable for the following components:   Glucose, Bld 116 (*)    BUN 6 (*)    Calcium 8.3 (*)    Albumin 3.4 (*)    AST 42 (*)    All other components within normal limits  SALICYLATE LEVEL - Abnormal; Notable for the following components:   Salicylate Lvl <3.4 (*)    All other components within normal limits  ETHANOL - Abnormal; Notable for the following components:   Alcohol, Ethyl (B) 313 (*)    All other components within normal limits  ACETAMINOPHEN LEVEL - Abnormal; Notable for the following components:   Acetaminophen (Tylenol), Serum <10 (*)    All other components within normal limits  AMMONIA - Abnormal; Notable for the following components:   Ammonia 112 (*)    All other components within normal limits  SARS CORONAVIRUS 2 BY RT PCR (HOSPITAL ORDER, [place] [facility])  URINE DRUG SCREEN, QUALITATIVE ([facility] ONLY)  URINALYSIS, COMPLETE (UACMP) WITH MICROSCOPIC  PROTIME-INR  COMPREHENSIVE METABOLIC PANEL  MAGNESIUM  PHOSPHORUS  CBC  HIV ANTIBODY (ROUTINE TESTING W REFLEX)  MAGNESIUM  PHOSPHORUS  CBC WITH DIFFERENTIAL/PLATELET  TSH  COMPREHENSIVE METABOLIC PANEL  AMMONIA  TROPONIN I (HIGH SENSITIVITY)  TROPONIN I (HIGH SENSITIVITY)   ____________________________________________  12 Lead EKG  Sinus rhythm, rate of 86 bpm, normal axis and intervals.  No evidence of acute ischemia. ____________________________________________  RADIOLOGY  ED MD interpretation: CT head reviewed without  evidence of acute ICH.  CXR reviewed without evidence of acute cardiopulmonary pathology  Official radiology report(s): CT Head Wo Contrast  Result Date: 06/13/2020 CLINICAL DATA:  Change in mental status EXAM: CT HEAD WITHOUT CONTRAST TECHNIQUE: Contiguous axial images were obtained from the base of the skull through the vertex without intravenous contrast. COMPARISON:  April 21, 2020 FINDINGS: Brain: No evidence of acute territorial infarction, hemorrhage, hydrocephalus,extra-axial collection or mass lesion/mass effect. Normal gray-white differentiation. Ventricles are normal in size and contour. Vascular: No hyperdense vessel or unexpected calcification. Skull: The skull is intact. No fracture or focal lesion identified. Sinuses/Orbits: The visualized paranasal sinuses and mastoid air cells are clear. The orbits and globes intact. Other: None IMPRESSION: No acute intracranial abnormality. Electronically Signed   By: Prudencio Pair M.D.   On: 06/13/2020 21:04   DG Chest Portable 1 View  Result Date: 06/13/2020 CLINICAL DATA:  Altered mental status EXAM: PORTABLE CHEST 1  VIEW COMPARISON:  06/17/2013 FINDINGS: Heart and mediastinal contours are within normal limits. No focal opacities or effusions. No acute bony abnormality. IMPRESSION: No active disease. Electronically Signed   By: Rolm Baptise M.D.   On: 06/13/2020 21:24   US Abdomen Limited RUQ  Result Date: 06/13/2020 CLINICAL DATA:  Hepatic encephalopathy EXAM: ULTRASOUND ABDOMEN LIMITED RIGHT UPPER QUADRANT COMPARISON:  None. FINDINGS: Gallbladder: No gallstones or wall thickening visualized. No sonographic Murphy sign noted by sonographer. Common bile duct: Diameter: Normal caliber, 5 mm. Liver: Increased echotexture compatible with fatty infiltration. No focal abnormality or biliary ductal dilatation. Portal vein is patent on color Doppler imaging with normal direction of blood flow towards the liver. Other: None. IMPRESSION: Fatty infiltration of the  liver.  No acute findings. Electronically Signed   By: Rolm Baptise M.D.   On: 06/13/2020 22:52    ____________________________________________   PROCEDURES and INTERVENTIONS  Procedure(s) performed (including Critical Care):  Procedures  Medications  pantoprazole (PROTONIX) EC tablet 40 mg (has no administration in time range)  nortriptyline (PAMELOR) capsule 10 mg (10 mg Oral Given 06/13/20 2316)  losartan (COZAAR) tablet 100 mg (has no administration in time range)  sodium chloride flush (NS) 0.9 % injection 3 mL (3 mLs Intravenous Not Given 06/13/20 2231)  ondansetron (ZOFRAN) tablet 4 mg (has no administration in time range)    Or  ondansetron (ZOFRAN) injection 4 mg (has no administration in time range)  0.9 %  sodium chloride infusion ( Intravenous New Bag/Given 06/13/20 2315)  LORazepam (ATIVAN) tablet 1-4 mg (has no administration in time range)    Or  LORazepam (ATIVAN) injection 1-4 mg (has no administration in time range)  thiamine tablet 100 mg (has no administration in time range)    Or  thiamine (B-1) injection 100 mg (has no administration in time range)  folic acid (FOLVITE) tablet 1 mg (has no administration in time range)  multivitamin with minerals tablet 1 tablet (has no administration in time range)  lactulose (CHRONULAC) 10 GM/15ML solution 30 g (has no administration in time range)  thiamine (B-1) injection 500 mg (500 mg Intramuscular Given 06/13/20 2316)    ____________________________________________   MDM / ED COURSE  59 year old male without known liver disease presenting to the ED with progressively worsening chronic short-term memory loss and cognitive decline, with evidence of acute hepatic encephalopathy and requiring medical admission.  Normal vital signs on room air.  Exam with a nonfocal neurologic exam that demonstrates no focal deficits, distress or signs of trauma.  Patient does have signs of short-term memory loss and frequently has to ask his  wife to answer more complex questions or those that test to short-term memory.  Blood work demonstrates elevated ammonia greater than 100 as a likely contributor to his cognitive decline.  Soundly patient is drinking more at home than when he lets onto his wife.  Provide the patient thiamine due to his confusion to treat any possible Wernicke's encephalopathy.  Multiple outpatient EEGs and MRIs noted, and I see no reason to emergently repeat any of these studies.  CT head here without acute pathology.  We will admit the patient to hospitalist medicine for further work-up and management.  Clinical Course as of Jun 13 2321  Sat Jun 13, 2020  2155 Educated patient and wife on diagnosis of hepatic encephalopathy.  We discussed management with lactulose.  We discussed medical admission.   [DS]  2207 Spoke with admitting hospitalist who agrees to admit the patient   [  DS]    Clinical Course User Index [DS] Vladimir Crofts, MD     ____________________________________________   FINAL CLINICAL IMPRESSION(S) / ED DIAGNOSES  Final diagnoses:  Hepatic encephalopathy ([place])  Short-term memory loss  Delirium     ED Discharge Orders    None       Diamante Rubin   Note:  This document was prepared using Dragon voice recognition software and may include unintentional dictation errors.   Vladimir Crofts, MD 06/13/20 [phone]

## 2020-06-13 NOTE — ED Notes (Signed)
Pt in bed with wife at bedside. Pt is alert, unable to explain events leading to ED arrival, speak is garbled at this time. Will continue to assess after pt returns to ED

## 2020-06-13 NOTE — ED Notes (Signed)
md notified of pt's presenting symptoms, orders received.

## 2020-06-13 NOTE — H&P (Signed)
Alexander Mendoza EZM:[id number] DOB: 08/18/1961 DOA: 06/13/2020     PCP: Alexander American, PA-C   Outpatient Specialists:    NEurology  Dr. Melrose Mendoza    Patient arrived to ER on 06/13/20 at 2012 Referred by Attending Alexander Crofts, MD   Patient coming from: home Lives  With family    Chief Complaint:   Chief Complaint  Patient presents with  . Altered Mental Status    HPI: Alexander Mendoza is a 59 y.o. male with medical history significant of alcohol abuse, HTN, obesity, GERD    Presented with mental status changes. Patient have been evaluated in the past by neurology for ongoing troubles with confusion mainly in the afternoons. For prolonged period time patient appears to be clear minded in the morning but by the time his wife come back from work he was noted to be more confused and lethargic. He have had extensive work-up Lexapro S MRI of the brain which was unremarkable At some point he had seizure-like activity EEG was done and was unremarkable  Per wife he has episodes of difficulty with balance slurred speech mood changes and memory difficulties particularly short-term memory loss. Usually occurs at night. Has started getting more more frequent. Patient did describe that he has occasional headaches or vision changes occurring about 2-3 times monthly Patient was recently started on clonazepam and nortriptyline for headache prevention's Of his headaches.  Today patient had most severe episode of confusion his eyes appeared to be not focusing he was not able to stand up he was slumped Patient did admit to drinking a bit today. Per wife she felt that patient does not consume alcohol on regular basis After extensive questioning turned out to be that patient is taking much high levels of alcohol during the day when wife is not present. And it most likely she has been seen him intoxicated at night and that has explain a lot of his symptoms.  Infectious risk factors:  Reports  severe fatigue     Has   been vaccinated against COVID in March   Initial COVID TEST   in house  PCR testing  Pending  No results found for: SARSCOV2NAA   Regarding pertinent Chronic problems:     HTN on Norvasc, Cozaar  obesity-   BMI Readings from Last 1 Encounters:  06/05/20 32.69 kg/m   While in ER: ETOH level >300 Ammonia  Level 112 Right upper quadrant for central fatty infiltration of the liver Hospitalist was called for admission for possible hepatic encephalopathy versus acute alcohol intoxication  The following Work up has been ordered so far:  Orders Placed This Encounter  Procedures  . SARS Coronavirus 2 by RT PCR (hospital order, performed in [facility] hospital lab) Nasopharyngeal Nasopharyngeal Swab  . CT Head Wo Contrast  . DG Chest Portable 1 View  . US Abdomen Limited RUQ  . CBC with Differential  . Comprehensive metabolic panel  . Urine Drug Screen, Qualitative ([place] only)  . Urinalysis, Complete w Microscopic  . Salicylate level  . Ethanol  . Acetaminophen level  . Ammonia  . Protime-INR  . Cardiac monitoring  . Consult to hospitalist  ALL PATIENTS BEING ADMITTED/HAVING PROCEDURES NEED COVID-19 SCREENING  . Pulse oximetry, continuous  . EKG 12-Lead  . ED EKG  . Saline lock IV     Following Medications were ordered in ER: Medications  thiamine (B-1) injection 500 mg (has no administration in time range)  Consult Orders  (From admission, onward)         Start     Ordered   06/13/20 2156  Consult to hospitalist  ALL PATIENTS BEING ADMITTED/HAVING PROCEDURES NEED COVID-19 SCREENING  Once       Comments: ALL PATIENTS BEING ADMITTED/HAVING PROCEDURES NEED COVID-19 SCREENING  Provider:  (Not yet assigned)  Question Answer Comment  Place call to: hospitalist   Reason for Consult Admit   Diagnosis/Clinical Info for Consult: helpatic encephalopathy Room 19. Charlsie Quest, [id number]     06/13/20 2155           Significant initial   Findings: Abnormal Labs Reviewed  CBC WITH DIFFERENTIAL/PLATELET - Abnormal; Notable for the following components:      Result Value   Hemoglobin 17.6 (*)    HCT 53.4 (*)    All other components within normal limits  COMPREHENSIVE METABOLIC PANEL - Abnormal; Notable for the following components:   Glucose, Bld 116 (*)    BUN 6 (*)    Calcium 8.3 (*)    Albumin 3.4 (*)    AST 42 (*)    All other components within normal limits  SALICYLATE LEVEL - Abnormal; Notable for the following components:   Salicylate Lvl <2.9 (*)    All other components within normal limits  ETHANOL - Abnormal; Notable for the following components:   Alcohol, Ethyl (B) 313 (*)    All other components within normal limits  ACETAMINOPHEN LEVEL - Abnormal; Notable for the following components:   Acetaminophen (Tylenol), Serum <10 (*)    All other components within normal limits  AMMONIA - Abnormal; Notable for the following components:   Ammonia 112 (*)    All other components within normal limits    Otherwise labs showing:    Recent Labs  Lab 06/13/20 2050  NA 145  K 3.6  CO2 27  GLUCOSE 116*  BUN 6*  CREATININE 1.13  CALCIUM 8.3*    Cr   stable,    Lab Results  Component Value Date   CREATININE 1.13 06/13/2020   CREATININE 1.04 06/05/2020   CREATININE 1.14 04/01/2020    Recent Labs  Lab 06/13/20 2050  AST 42*  ALT 36  ALKPHOS 84  BILITOT 0.8  PROT 6.8  ALBUMIN 3.4*   Lab Results  Component Value Date   CALCIUM 8.3 (L) 06/13/2020     WBC      Component Value Date/Time   WBC 8.7 06/13/2020 2050   ANC    Component Value Date/Time   NEUTROABS 5.1 06/13/2020 2050   NEUTROABS 3.0 06/05/2020 1543   ALC No components found for: LYMPHAB    Plt: Lab Results  Component Value Date   PLT 225 06/13/2020      HG/HCT  stable,      Component Value Date/Time   HGB 17.6 (H) 06/13/2020 2050   HGB 18.3 (H) 06/05/2020 1543   HCT 53.4 (H) 06/13/2020 2050   HCT 53.4 (H)  06/05/2020 1543    No results for input(s): LIPASE, AMYLASE in the last 168 hours. Recent Labs  Lab 06/13/20 2111  AMMONIA 112*     ECG: Ordered Personally reviewed by me showing: HR : 86 Rhythm:  NSR,   no evidence of ischemic changes QTC 452   BNP (last 3 results) No results for input(s): BNP in the last 8760 hours.    DM  labs:  HbA1C: Recent Labs    09/30/19 1650  HGBA1C 5.2  UA   no evidence of UTI      Urine analysis:    Component Value Date/Time   COLORURINE COLORLESS (A) 06/13/2020 2322   APPEARANCEUR CLEAR (A) 06/13/2020 2322   APPEARANCEUR Clear 06/05/2020 1541   LABSPEC 1.002 (L) 06/13/2020 2322   PHURINE 7.0 06/13/2020 2322   GLUCOSEU NEGATIVE 06/13/2020 2322   HGBUR NEGATIVE 06/13/2020 2322   BILIRUBINUR NEGATIVE 06/13/2020 2322   BILIRUBINUR Negative 06/05/2020 1541   [place] 06/13/2020 2322   PROTEINUR NEGATIVE 06/13/2020 2322   NITRITE NEGATIVE 06/13/2020 2322   LEUKOCYTESUR NEGATIVE 06/13/2020 2322       Ordered  CT HEAD  NON acute  CXR -  NON acute  RUQ [place] - fatty liver   ED Triage Vitals  Enc Vitals Group     BP 06/13/20 2031 (!) 142/77     Pulse Rate 06/13/20 2031 84     Resp 06/13/20 2031 16     Temp 06/13/20 2031 98.7 F (37.1 C)     Temp Source 06/13/20 2031 Oral     SpO2 06/13/20 2031 95 %     Weight --      Height --      Head Circumference --      Peak Flow --      Pain Score 06/13/20 2032 0     Pain Loc --      Pain Edu? --      Excl. in [place]? --   TMAX(24)@       Latest  Blood pressure 139/81, pulse 87, temperature 98.7 F (37.1 C), temperature source Oral, resp. rate 19, SpO2 96 %.      Review of Systems:    Pertinent positives include: confusion slurred speech, gait abnormality,  Constitutional:  No weight loss, night sweats, Fevers, chills, fatigue, weight loss  HEENT:  No headaches, Difficulty swallowing,Tooth/dental problems,Sore throat,  No sneezing, itching, ear ache, nasal  congestion, post nasal drip,  Cardio-vascular:  No chest pain, Orthopnea, PND, anasarca, dizziness, palpitations.no Bilateral lower extremity swelling  GI:  No heartburn, indigestion, abdominal pain, nausea, vomiting, diarrhea, change in bowel habits, loss of appetite, melena, blood in stool, hematemesis Resp:  no shortness of breath at rest. No dyspnea on exertion, No excess mucus, no productive cough, No non-productive cough, No coughing up of blood.No change in color of mucus.No wheezing. Skin:  no rash or lesions. No jaundice GU:  no dysuria, change in color of urine, no urgency or frequency. No straining to urinate.  No flank pain.  Musculoskeletal:  No joint pain or no joint swelling. No decreased range of motion. No back pain.  Psych:  No change in mood or affect. No depression or anxiety. No memory loss.  Neuro: no localizing neurological complaints, no tingling, no weakness, no double vision,    All systems reviewed and apart from [place] all are negative  Past Medical History:   Past Medical History:  Diagnosis Date  . Allergy   . Chronic kidney disease   . GERD (gastroesophageal reflux disease)   . Hyperlipidemia   . Hypertension      Past Surgical History:  Procedure Laterality Date  . VASECTOMY     Social History:  Ambulatory   independently       reports that he has never smoked. His smokeless tobacco use includes snuff. He reports current alcohol use. He reports that he does not use drugs.   Family History:   Family History  Problem Relation Age  of Onset  . Cancer Mother        Breast  . Hypertension Father   . Cancer Brother        Testicular    Allergies: Allergies  Allergen Reactions  . Benazepril Cough     Prior to Admission medications   Medication Sig Start Date End Date Taking? Authorizing Provider  albuterol (PROVENTIL HFA;VENTOLIN HFA) 108 (90 Base) MCG/ACT inhaler Inhale 1 puff into the lungs every 6 (six) hours as needed for wheezing  or shortness of breath. Patient not taking: Reported on 04/01/2020 04/20/17   Alexander American, PA-C  amLODipine (NORVASC) 5 MG tablet Take 1 tablet (5 mg total) by mouth daily. 04/01/20   Alexander American, PA-C  azelastine (ASTELIN) 0.1 % nasal spray Place 1 spray into both nostrils 2 (two) times daily. 11/22/17   Guadalupe Maple, MD  clonazePAM (KLONOPIN) 0.5 MG tablet Take 0.25 mg by mouth 2 (two) times daily. 05/27/20   [provider]  cyanocobalamin 1000 MCG tablet Take by mouth.    [provider]  cyclobenzaprine (FLEXERIL) 10 MG tablet Take 1 tablet (10 mg total) by mouth 3 (three) times daily as needed for muscle spasms. Patient not taking: Reported on 06/05/2020 03/24/20   Alexander American, PA-C  dexlansoprazole [facility]) 60 MG capsule Take 1 capsule (60 mg total) by mouth daily. 04/01/20   Alexander American, PA-C  fluticasone [facility]) 50 MCG/ACT nasal spray Place 2 sprays into both nostrils daily. 02/05/20   Alexander American, PA-C  JATENZO 237 MG CAPS Take 1 capsule by mouth 2 (two) times daily. 09/13/19   [provider]  levocetirizine (XYZAL) 5 MG tablet Take 1 tablet (5 mg total) by mouth every evening. 02/12/20   Alexander American, PA-C  losartan (COZAAR) 100 MG tablet Take 1 tablet (100 mg total) by mouth daily. 04/01/20   Alexander American, PA-C  Magnesium Citrate 100 MG TABS Take by mouth.    [provider]  Multiple Vitamin (MULTI-VITAMINS) TABS Take by mouth daily.    [provider]  nortriptyline (PAMELOR) 10 MG capsule Take 10 mg by mouth at bedtime. 05/27/20   [provider]  Omega-3 Fatty Acids (FISH OIL) 1200 MG CAPS Take 2,400 mg by mouth daily.    [provider]  tadalafil (CIALIS) 10 MG tablet Take 1 tablet (10 mg total) by mouth daily as needed for erectile dysfunction. 04/01/20   Alexander American, PA-C  vitamin E 400 UNIT capsule Take 800 Units by mouth daily.     [provider]   Physical Exam: Vitals with BMI 06/13/2020 06/13/2020 06/13/2020  Height - - -  Weight - - -  BMI - - -  Systolic 622 297 989  Diastolic 81 78 77  Pulse 87 86 84     1. General:  in No Acute distress    Chronically ill -appearing 2. Psychological: Alert and  Oriented 3. Head/ENT:   Moist   Mucous Membranes                          Head Non traumatic, neck supple                           Poor Dentition 4. SKIN:  decreased Skin turgor,  Skin clean Dry and intact no rash, facial erythema 5. Heart: Regular rate and rhythm no  Murmur, no Rub or gallop 6. Lungs: Clear to auscultation bilaterally, no wheezes or crackles   7. Abdomen: Soft, non-tender, Non distended   Obese bowel sounds present 8. Lower extremities: no clubbing, cyanosis, no edema 9. Neurologically Grossly intact, moving all 4 extremities equally no asterexis 10. MSK: Normal range of motion   All other LABS:     Recent Labs  Lab 06/13/20 2050  WBC 8.7  NEUTROABS 5.1  HGB 17.6*  HCT 53.4*  MCV 98.3  PLT 225     Recent Labs  Lab 06/13/20 2050  NA 145  K 3.6  CL 105  CO2 27  GLUCOSE 116*  BUN 6*  CREATININE 1.13  CALCIUM 8.3*     Recent Labs  Lab 06/13/20 2050  AST 42*  ALT 36  ALKPHOS 84  BILITOT 0.8  PROT 6.8  ALBUMIN 3.4*       Cultures: No results found for: SDES, SPECREQUEST, CULT, REPTSTATUS   Radiological Exams on Admission: CT Head Wo Contrast  Result Date: 06/13/2020 CLINICAL DATA:  Change in mental status EXAM: CT HEAD WITHOUT CONTRAST TECHNIQUE: Contiguous axial images were obtained from the base of the skull through the vertex without intravenous contrast. COMPARISON:  April 21, 2020 FINDINGS: Brain: No evidence of acute territorial infarction, hemorrhage, hydrocephalus,extra-axial collection or mass lesion/mass effect. Normal gray-white differentiation. Ventricles are normal in size and contour. Vascular: No hyperdense vessel or unexpected calcification.  Skull: The skull is intact. No fracture or focal lesion identified. Sinuses/Orbits: The visualized paranasal sinuses and mastoid air cells are clear. The orbits and globes intact. Other: None IMPRESSION: No acute intracranial abnormality. Electronically Signed   By: Prudencio Pair M.D.   On: 06/13/2020 21:04   DG Chest Portable 1 View  Result Date: 06/13/2020 CLINICAL DATA:  Altered mental status EXAM: PORTABLE CHEST 1 VIEW COMPARISON:  06/17/2013 FINDINGS: Heart and mediastinal contours are within normal limits. No focal opacities or effusions. No acute bony abnormality. IMPRESSION: No active disease. Electronically Signed   By: Rolm Baptise M.D.   On: 06/13/2020 21:24    Chart has been reviewed    Assessment/Plan  59 y.o. male with medical history significant of alcohol abuse, HTN, obesity, GERD Admitted for hepatic ancephalopathy  Present on Admission: . Hepatic encephalopathy (HCC) -elevated ammonia level.  Initiate lactulose and continue to monitor repeat in a.m. spoke about importance of quitting alcohol  . GERD (gastroesophageal reflux disease) -chronic stable continue home Medications  . Hypertension resume home medications when able  . Alcohol abuse with intoxication ([place]) -monitor for any signs of withdrawal spoke about importance of patient abstaining from alcohol probably will need to be discharged on Librium protocol.  Right upper quadrant ultrasound showing fatty liver infiltration no evidence of cirrhosis yet   Other plan as per orders.  DVT prophylaxis:  SCD      Code Status:    Code Status: Not on file FULL CODE  as per patient   I had personally discussed CODE STATUS with patient    Family Communication:   Family not at  Bedside     Disposition Plan:     To home once workup is complete and patient is stable   Following barriers for discharge:                          Mental status stabilize No evidence of withdrawal symptoms   EXPECT DC tomorrow  Transition of care consulted                                      Consults called: none  Admission status:  ED Disposition    ED Disposition Condition [place]: [place] [[id number]]  Level of Care: Med-Surg [16]  Covid Evaluation: Asymptomatic Screening Protocol (No Symptoms)  Diagnosis: Hepatic encephalopathy ([place]) [572.2.ICD-9-CM]  Admitting Physician: Toy Baker [[id number]]  Attending Physician: Toy Baker [[id number]]        Obs    Level of care     tele   indefinitely please discontinue once patient no longer qualifies COVID-19 Labs   No results found for: SARSCOV2NAA   Precautions: admitted as asymptomatic screening protocol    PPE: Used by the provider:   P100  eye Goggles,  Gloves     Lynton Crescenzo 06/14/2020, 12:09 AM    [facility]     after 2 AM please page floor coverage PA If 7AM-7PM, please contact the day team taking care of the patient using [email or web address]   Patient was evaluated in the context of the global COVID-19 pandemic, which necessitated consideration that the patient might be at risk for infection with the SARS-CoV-2 virus that causes COVID-19. Institutional protocols and algorithms that pertain to the evaluation of patients at risk for COVID-19 are in a state of rapid change based on information released by regulatory bodies including the CDC and federal and state organizations. These policies and algorithms were followed during the patient's care.

## 2020-06-13 NOTE — ED Triage Notes (Addendum)
Pt's spouse states that last seen normal at 1430. Per spouse she came home at 1830 and pt's eyes were not focusing, was not able to stand and slumped in chair, not able to raise head with slurred speech. Pt is currently with clear speech, face symmetrical, but is having word finding difficulty. Pt states did have etoh today, but spouse says pt does not consume etoh. Pt cannot tell RN why he is here, where he is, or date, birthday.

## 2020-06-13 NOTE — ED Notes (Signed)
Pt to CT

## 2020-06-14 ENCOUNTER — Other Ambulatory Visit: Payer: Self-pay

## 2020-06-14 ENCOUNTER — Encounter: Payer: Self-pay | Admitting: Internal Medicine

## 2020-06-14 DIAGNOSIS — F10129 Alcohol abuse with intoxication, unspecified: Secondary | ICD-10-CM | POA: Diagnosis not present

## 2020-06-14 DIAGNOSIS — K7290 Hepatic failure, unspecified without coma: Secondary | ICD-10-CM | POA: Diagnosis not present

## 2020-06-14 LAB — COMPREHENSIVE METABOLIC PANEL
ALT: 33 U/L (ref 0–44)
ALT: 35 U/L (ref 0–44)
AST: 52 U/L — ABNORMAL HIGH (ref 15–41)
AST: 43 U/L — ABNORMAL HIGH (ref 15–41)
Albumin: 3.3 g/dL — ABNORMAL LOW (ref 3.5–5.0)
Albumin: 3.1 g/dL — ABNORMAL LOW (ref 3.5–5.0)
Alkaline Phosphatase: 82 U/L (ref 38–126)
Alkaline Phosphatase: 80 U/L (ref 38–126)
Anion gap: 10 (ref 5–15)
Anion gap: 11 (ref 5–15)
BUN: 6 mg/dL — ABNORMAL LOW (ref 8–23)
BUN: 7 mg/dL — ABNORMAL LOW (ref 8–23)
CO2: 30 mmol/L (ref 22–32)
CO2: 26 mmol/L (ref 22–32)
Calcium: 8.2 mg/dL — ABNORMAL LOW (ref 8.9–10.3)
Calcium: 8 mg/dL — ABNORMAL LOW (ref 8.9–10.3)
Chloride: 103 mmol/L (ref 98–111)
Chloride: 108 mmol/L (ref 98–111)
Creatinine, Ser: 1.18 mg/dL (ref 0.61–1.24)
Creatinine, Ser: 1.1 mg/dL (ref 0.61–1.24)
GFR calc Af Amer: >60 mL/min (ref >60)
GFR calc Af Amer: >60 mL/min (ref >60)
GFR calc non Af Amer: >60 mL/min (ref >60)
GFR calc non Af Amer: >60 mL/min (ref >60)
Glucose, Bld: 95 mg/dL (ref 70–99)
Glucose, Bld: 115 mg/dL — ABNORMAL HIGH (ref 70–99)
Potassium: 4.1 mmol/L (ref 3.5–5.1)
Potassium: 3.7 mmol/L (ref 3.5–5.1)
Sodium: 143 mmol/L (ref 135–145)
Sodium: 145 mmol/L (ref 135–145)
Total Bilirubin: 1 mg/dL (ref 0.3–1.2)
Total Bilirubin: 0.9 mg/dL (ref 0.3–1.2)
Total Protein: 7 g/dL (ref 6.5–8.1)
Total Protein: 6.5 g/dL (ref 6.5–8.1)

## 2020-06-14 LAB — CBC WITH DIFFERENTIAL/PLATELET
Abs Immature Granulocytes: 0.03 10*3/uL (ref 0.00–0.07)
Basophils Absolute: 0.1 10*3/uL (ref 0.0–0.1)
Basophils Relative: 2 %
Eosinophils Absolute: 0.3 10*3/uL (ref 0.0–0.5)
Eosinophils Relative: 5 %
HCT: 51.2 % (ref 39.0–52.0)
Hemoglobin: 17.1 g/dL — ABNORMAL HIGH (ref 13.0–17.0)
Immature Granulocytes: 1 %
Lymphocytes Relative: 33 %
Lymphs Abs: 1.7 10*3/uL (ref 0.7–4.0)
MCH: 32.8 pg (ref 26.0–34.0)
MCHC: 33.4 g/dL (ref 30.0–36.0)
MCV: 98.3 fL (ref 80.0–100.0)
Monocytes Absolute: 0.5 10*3/uL (ref 0.1–1.0)
Monocytes Relative: 10 %
Neutro Abs: 2.5 10*3/uL (ref 1.7–7.7)
Neutrophils Relative %: 49 %
Platelets: 209 10*3/uL (ref 150–400)
RBC: 5.21 MIL/uL (ref 4.22–5.81)
RDW: 14.2 % (ref 11.5–15.5)
WBC: 5.2 10*3/uL (ref 4.0–10.5)
nRBC: 0 % (ref 0.0–0.2)

## 2020-06-14 LAB — TROPONIN I (HIGH SENSITIVITY): Troponin I (High Sensitivity): 7 ng/L (ref <18)

## 2020-06-14 LAB — URINE DRUG SCREEN, QUALITATIVE (ARMC ONLY)
Amphetamines, Ur Screen: NOT DETECTED
Barbiturates, Ur Screen: NOT DETECTED
Benzodiazepine, Ur Scrn: NOT DETECTED
Cannabinoid 50 Ng, Ur ~~LOC~~: NOT DETECTED
Cocaine Metabolite,Ur ~~LOC~~: NOT DETECTED
MDMA (Ecstasy)Ur Screen: NOT DETECTED
Methadone Scn, Ur: NOT DETECTED
Opiate, Ur Screen: NOT DETECTED
Phencyclidine (PCP) Ur S: NOT DETECTED
Tricyclic, Ur Screen: NOT DETECTED

## 2020-06-14 LAB — URINALYSIS, COMPLETE (UACMP) WITH MICROSCOPIC
Bacteria, UA: NONE SEEN
Bilirubin Urine: NEGATIVE
Glucose, UA: NEGATIVE mg/dL
Hgb urine dipstick: NEGATIVE
Ketones, ur: NEGATIVE mg/dL
Leukocytes,Ua: NEGATIVE
Nitrite: NEGATIVE
Protein, ur: NEGATIVE mg/dL
Specific Gravity, Urine: 1.002 — ABNORMAL LOW (ref 1.005–1.030)
Squamous Epithelial / HPF: NONE SEEN (ref 0–5)
WBC, UA: NONE SEEN WBC/hpf (ref 0–5)
pH: 7 (ref 5.0–8.0)

## 2020-06-14 LAB — PROTIME-INR
INR: 0.9 (ref 0.8–1.2)
Prothrombin Time: 11.7 seconds (ref 11.4–15.2)

## 2020-06-14 LAB — MAGNESIUM
Magnesium: 2.1 mg/dL (ref 1.7–2.4)
Magnesium: 1.8 mg/dL (ref 1.7–2.4)

## 2020-06-14 LAB — PHOSPHORUS
Phosphorus: 3.2 mg/dL (ref 2.5–4.6)
Phosphorus: 2.9 mg/dL (ref 2.5–4.6)

## 2020-06-14 LAB — SARS CORONAVIRUS 2 BY RT PCR (HOSPITAL ORDER, PERFORMED IN ~~LOC~~ HOSPITAL LAB): SARS Coronavirus 2: NEGATIVE

## 2020-06-14 LAB — AMMONIA: Ammonia: 62 umol/L — ABNORMAL HIGH (ref 9–35)

## 2020-06-14 LAB — TSH: TSH: 2.722 u[IU]/mL (ref 0.350–4.500)

## 2020-06-14 LAB — HIV ANTIBODY (ROUTINE TESTING W REFLEX): HIV Screen 4th Generation wRfx: NONREACTIVE

## 2020-06-14 MED ORDER — DEXILANT 60 MG PO CPDR: 60.0000 mg | DELAYED_RELEASE_CAPSULE | Freq: Every day | ORAL | Status: DC

## 2020-06-14 MED ORDER — FOLIC ACID 1 MG PO TABS: 1.0000 mg | ORAL_TABLET | Freq: Every day | ORAL | Status: DC

## 2020-06-14 MED ORDER — AZELASTINE HCL 0.1 % NA SOLN: 1.0000 | Freq: Every day | NASAL | 12 refills | Status: AC | PRN

## 2020-06-14 MED ORDER — LACTULOSE 10 GM/15ML PO SOLN: 30.0000 g | Freq: Three times a day (TID) | ORAL | 2 refills | Status: AC

## 2020-06-14 MED ORDER — FLUTICASONE PROPIONATE 50 MCG/ACT NA SUSP: 2.0000 | Freq: Every day | NASAL | 12 refills | Status: AC | PRN

## 2020-06-14 MED ORDER — THIAMINE HCL 100 MG PO TABS: 100.0000 mg | ORAL_TABLET | Freq: Every day | ORAL | Status: DC

## 2020-06-14 NOTE — Discharge Summary (Signed)
Physician Discharge Summary   Alexander Mendoza  male DOB: June 08, 1961  GHW:[id number]  PCP: Volney American, PA-C  Admit date: 06/13/2020 Discharge date: 06/14/2020  Admitted From: home Disposition:  home Wife updated at the bedside about discharge plans.  CODE STATUS: Full code  Discharge Instructions    Discharge instructions   Complete by: As directed    You have elevated ammonia level that caused your altered mental status.  You have been prescribed lactulose.  You can titrate the lactulose dose to aim for 2-3 bowel movements per day.  You have signs of liver injuries now.  Please stop drinking alcohol completely.  Also start taking folate and thiamine supplements (over-the-counter).  Please follow up with your outpatient GI doctor.   Dr. Enzo Bi [facility] Course:  For full details, please see H&P, progress notes, consult notes and ancillary notes.  Briefly,  Alexander Mendoza is a 59 y.o. Caucasian male with medical history significant of alcohol abuse, HTN, obesity, GERD who presented with mental status changes.   Patient had been evaluated in the past by neurology for ongoing troubles with confusion mainly in the afternoons. For prolonged period time patient appeared to be clear minded in the morning but by the time his wife come back from work he was noted to be more confused and lethargic. He have had extensive work-up Lexapro S MRI of the brain which was unremarkable.  At some point he had seizure-like activity EEG was done and was unremarkable  Per wife he had episodes of difficulty with balance slurred speech mood changes and memory difficulties particularly short-term memory loss. Usually occured at night. Has started getting more more frequent. Patient did describe that he has occasional headaches or vision changes occurring about 2-3 times monthly.  Patient was recently started on clonazepam and nortriptyline for headache prevention.  Per wife she  felt that patient does not consume alcohol on regular basis After extensive questioning turned out to be that patient has been taking much high levels of alcohol during the day when wife was not present. And most likely she has been seen him intoxicated at night and that has explained a lot of his symptoms.  AMS 2/2 alcohol intoxication and Hepatic encephalopathy  Alcohol level 313 and ammonia level 112 on presentation.  Pt had 1 BM after lactulose, and mental status was back to baseline the next day.  Both pt and wife felt pt was ready to go home.  Hepatic encephalopathy Hepatic steatosis 2/2 alcohol abuse Pt was a heavy drinker for a long time.  Wife thought pt had cut back recently but was not aware that pt continued to drink in her absence.  Pt received lactulose with resulting BM and improvement in mental status.  Pt was discharged on lactulose with instruction to titrate to 2-3 BM's per day.  Importance of complete alcohol abstinence was stressed to pt and wife.  Pt has a GI doctor that he will follow up as outpatient.  Marland Kitchen GERD (gastroesophageal reflux disease)  chronic stable continued home Medications  . Hypertension  Continued home medications    Discharge Diagnoses:  Active Problems:   Hypertension   GERD (gastroesophageal reflux disease)   Hepatic encephalopathy (HCC)   Alcohol abuse with intoxication [facility])    Discharge Instructions:  Allergies as of 06/14/2020      Reactions   Benazepril Cough      Medication List    TAKE  these medications   albuterol 108 (90 Base) MCG/ACT inhaler Commonly known as: VENTOLIN HFA Inhale 1 puff into the lungs every 6 (six) hours as needed for wheezing or shortness of breath.   amLODipine 5 MG tablet Commonly known as: NORVASC Take 1 tablet (5 mg total) by mouth daily.   azelastine 0.1 % nasal spray Commonly known as: ASTELIN Place 1 spray into both nostrils daily as needed for rhinitis. What changed:   when to take  this  reasons to take this   clonazePAM 0.5 MG tablet Commonly known as: KLONOPIN Take 0.25 mg by mouth 2 (two) times daily.   cyanocobalamin 1000 MCG tablet Take 1,000 mcg by mouth daily.   cyclobenzaprine 10 MG tablet Commonly known as: FLEXERIL Take 1 tablet (10 mg total) by mouth 3 (three) times daily as needed for muscle spasms.   Dexilant 60 MG capsule Generic drug: dexlansoprazole Take 1 capsule (60 mg total) by mouth at bedtime.   Fish Oil 1200 MG Caps Take 2,400 mg by mouth daily.   fluticasone 50 MCG/ACT nasal spray Commonly known as: FLONASE Place 2 sprays into both nostrils daily as needed for allergies or rhinitis. What changed:   when to take this  reasons to take this   folic acid 1 MG tablet Commonly known as: FOLVITE Take 1 tablet (1 mg total) by mouth daily. Start taking on: June 15, 2020   Jatenzo 237 MG Caps Generic drug: Testosterone Undecanoate Take 1 capsule by mouth 2 (two) times daily.   lactulose 10 GM/15ML solution Commonly known as: CHRONULAC Take 45 mLs (30 g total) by mouth 3 (three) times daily. Titrate dose to aim for 2-3 bowel movements per day.   levocetirizine 5 MG tablet Commonly known as: Xyzal Take 1 tablet (5 mg total) by mouth every evening.   losartan 100 MG tablet Commonly known as: COZAAR Take 1 tablet (100 mg total) by mouth daily.   Magnesium Citrate 100 MG Tabs Take 100 mg by mouth daily.   Multi-Vitamins Tabs Take by mouth daily.   nortriptyline 10 MG capsule Commonly known as: PAMELOR Take 10 mg by mouth at bedtime.   tadalafil 10 MG tablet Commonly known as: Cialis Take 1 tablet (10 mg total) by mouth daily as needed for erectile dysfunction.   thiamine 100 MG tablet Take 1 tablet (100 mg total) by mouth daily. Start taking on: June 15, 2020   Vitamin D (Cholecalciferol) 25 MCG (1000 UT) Tabs Take 1,000 Units by mouth daily.   vitamin E 180 MG (400 UNITS) capsule Take 800 Units by mouth  daily.        Follow-up Information    Volney American, PA-C. Schedule an appointment as soon as possible for a visit in 1 week(s).   Specialty: Family Medicine Contact information: [place] [address] [phone]        Your outpatient GI doctor. Schedule an appointment as soon as possible for a visit in 1 month(s).               Allergies  Allergen Reactions  . Benazepril Cough     The results of significant diagnostics from this hospitalization (including imaging, microbiology, ancillary and laboratory) are listed below for reference.   Consultations:   Procedures/Studies: CT Head Wo Contrast  Result Date: 06/13/2020 CLINICAL DATA:  Change in mental status EXAM: CT HEAD WITHOUT CONTRAST TECHNIQUE: Contiguous axial images were obtained from the base of the skull through the vertex  without intravenous contrast. COMPARISON:  April 21, 2020 FINDINGS: Brain: No evidence of acute territorial infarction, hemorrhage, hydrocephalus,extra-axial collection or mass lesion/mass effect. Normal gray-white differentiation. Ventricles are normal in size and contour. Vascular: No hyperdense vessel or unexpected calcification. Skull: The skull is intact. No fracture or focal lesion identified. Sinuses/Orbits: The visualized paranasal sinuses and mastoid air cells are clear. The orbits and globes intact. Other: None IMPRESSION: No acute intracranial abnormality. Electronically Signed   By: Prudencio Pair M.D.   On: 06/13/2020 21:04   DG Chest Portable 1 View  Result Date: 06/13/2020 CLINICAL DATA:  Altered mental status EXAM: PORTABLE CHEST 1 VIEW COMPARISON:  06/17/2013 FINDINGS: Heart and mediastinal contours are within normal limits. No focal opacities or effusions. No acute bony abnormality. IMPRESSION: No active disease. Electronically Signed   By: Rolm Baptise M.D.   On: 06/13/2020 21:24   US Abdomen Limited RUQ  Result Date: 06/13/2020 CLINICAL DATA:  Hepatic  encephalopathy EXAM: ULTRASOUND ABDOMEN LIMITED RIGHT UPPER QUADRANT COMPARISON:  None. FINDINGS: Gallbladder: No gallstones or wall thickening visualized. No sonographic Murphy sign noted by sonographer. Common bile duct: Diameter: Normal caliber, 5 mm. Liver: Increased echotexture compatible with fatty infiltration. No focal abnormality or biliary ductal dilatation. Portal vein is patent on color Doppler imaging with normal direction of blood flow towards the liver. Other: None. IMPRESSION: Fatty infiltration of the liver.  No acute findings. Electronically Signed   By: Rolm Baptise M.D.   On: 06/13/2020 22:52      Labs: BNP (last 3 results) No results for input(s): BNP in the last 8760 hours. Basic Metabolic Panel: Recent Labs  Lab 06/13/20 2050 06/13/20 2322 06/14/20 0430  NA 145 143 145  K 3.6 4.1 3.7  CL 105 103 108  CO2 27 30 26   GLUCOSE 116* 95 115*  BUN 6* 6* 7*  CREATININE 1.13 1.18 1.10  CALCIUM 8.3* 8.2* 8.0*  MG  --  2.1 1.8  PHOS  --  3.2 2.9   Liver Function Tests: Recent Labs  Lab 06/13/20 2050 06/13/20 2322 06/14/20 0430  AST 42* 52* 43*  ALT 36 33 35  ALKPHOS 84 82 80  BILITOT 0.8 1.0 0.9  PROT 6.8 7.0 6.5  ALBUMIN 3.4* 3.3* 3.1*   No results for input(s): LIPASE, AMYLASE in the last 168 hours. Recent Labs  Lab 06/13/20 2111 06/14/20 0430  AMMONIA 112* 62*   CBC: Recent Labs  Lab 06/13/20 2050 06/13/20 2322 06/14/20 0430  WBC 8.7 7.3 5.2  NEUTROABS 5.1  --  2.5  HGB 17.6* 17.5* 17.1*  HCT 53.4* 52.3* 51.2  MCV 98.3 98.1 98.3  PLT 225 240 209   Cardiac Enzymes: No results for input(s): CKTOTAL, CKMB, CKMBINDEX, TROPONINI in the last 168 hours. BNP: Invalid input(s): POCBNP CBG: No results for input(s): GLUCAP in the last 168 hours. D-Dimer No results for input(s): DDIMER in the last 72 hours. Hgb A1c No results for input(s): HGBA1C in the last 72 hours. Lipid Profile No results for input(s): CHOL, HDL, LDLCALC, TRIG, CHOLHDL,  LDLDIRECT in the last 72 hours. Thyroid function studies Recent Labs    06/14/20 0430  TSH 2.722   Anemia work up No results for input(s): VITAMINB12, FOLATE, FERRITIN, TIBC, IRON, RETICCTPCT in the last 72 hours. Urinalysis    Component Value Date/Time   COLORURINE COLORLESS (A) 06/13/2020 2322   APPEARANCEUR CLEAR (A) 06/13/2020 2322   APPEARANCEUR Clear 06/05/2020 1541   LABSPEC 1.002 (L) 06/13/2020 2322   PHURINE  7.0 06/13/2020 2322   GLUCOSEU NEGATIVE 06/13/2020 2322   HGBUR NEGATIVE 06/13/2020 2322   BILIRUBINUR NEGATIVE 06/13/2020 2322   BILIRUBINUR Negative 06/05/2020 1541   KETONESUR NEGATIVE 06/13/2020 2322   PROTEINUR NEGATIVE 06/13/2020 2322   NITRITE NEGATIVE 06/13/2020 2322   LEUKOCYTESUR NEGATIVE 06/13/2020 2322   Sepsis Labs Invalid input(s): PROCALCITONIN,  WBC,  LACTICIDVEN Microbiology Recent Results (from the past 240 hour(s))  SARS Coronavirus 2 by RT PCR (hospital order, performed in [place] hospital lab) Nasopharyngeal Nasopharyngeal Swab     Status: None   Collection Time: 06/13/20 11:23 PM   Specimen: Nasopharyngeal Swab  Result Value Ref Range Status   SARS Coronavirus 2 NEGATIVE NEGATIVE Final    Comment: (NOTE) SARS-CoV-2 target nucleic acids are NOT DETECTED.  The SARS-CoV-2 RNA is generally detectable in upper and lower respiratory specimens during the acute phase of infection. The lowest concentration of SARS-CoV-2 viral copies this assay can detect is 250 copies / mL. A negative result does not preclude SARS-CoV-2 infection and should not be used as the sole basis for treatment or other patient management decisions.  A negative result may occur with improper specimen collection / handling, submission of specimen other than nasopharyngeal swab, presence of viral mutation(s) within the areas targeted by this assay, and inadequate number of viral copies (<250 copies / mL). A negative result must be combined with clinical observations,  patient history, and epidemiological information.  Fact Sheet for Patients:   StrictlyIdeas.no  Fact Sheet for Healthcare Providers: BankingDealers.co.za  This test is not yet approved or  cleared by the [place] FDA and has been authorized for detection and/or diagnosis of SARS-CoV-2 by FDA under an Emergency Use Authorization (EUA).  This EUA will remain in effect (meaning this test can be used) for the duration of the COVID-19 declaration under Section 564(b)(1) of the Act, 21 U.S.C. section 360bbb-3(b)(1), unless the authorization is terminated or revoked sooner.  Performed at [facility], [address] [place], [place] [address]      Total time spend on discharging this patient, including the last patient exam, discussing the hospital stay, instructions for ongoing care as it relates to all pertinent caregivers, as well as preparing the medical discharge records, prescriptions, and/or referrals as applicable, is 45 minutes.    Enzo Bi, MD  [facility] 06/14/2020, 12:16 PM  If 7PM-7AM, please contact night-coverage

## 2020-06-14 NOTE — Progress Notes (Signed)
Patient being discharged home this afternoon. DC & Rx instructions given and nurse answered all questions. IV removed.

## 2020-06-14 NOTE — TOC Progression Note (Signed)
Transition of Care [facility]) - Progression Note    Patient Details  Name: Alexander Mendoza MRN: [id number] Date of Birth: February 05, 1961  [facility] CM/SW Contact  Boris Sharper, LCSW Phone Number: 06/14/2020, 12:52 PM  Clinical Narrative:    TOC consulted for substance abuse resources, CSW provided resource list to pt.        Expected Discharge Plan and Services           Expected Discharge Date: 06/14/20                                     Social Determinants of Health (SDOH) Interventions    Readmission Risk Interventions No flowsheet data found.

## 2020-08-03 ENCOUNTER — Other Ambulatory Visit: Payer: Self-pay | Admitting: Family Medicine

## 2020-08-03 DIAGNOSIS — I10 Essential (primary) hypertension: Secondary | ICD-10-CM

## 2020-08-03 DIAGNOSIS — K219 Gastro-esophageal reflux disease without esophagitis: Secondary | ICD-10-CM

## 2020-08-03 NOTE — Telephone Encounter (Signed)
Requested medication (s) are due for refill today: losartan, yes  Requested medication (s) are on the active medication list: yes Last refill:  07/03/20  Future visit scheduled: no  Notes to clinic:  recent d/c from rehab;  no visit upcoming visit  Requested medication (s) are due for refill today: dexilant, yes  Requested medication (s) are on the active medication list: yes  Last refill:  07/23/20  Future visit scheduled: no   Notes to clinic:  historical provider        Requested Prescriptions  Pending Prescriptions Disp Refills   losartan (COZAAR) 100 MG tablet [Pharmacy Med Name: LOSARTAN POTASSIUM 100 MG TAB] 90 tablet 1    Sig: TAKE 1 TABLET BY MOUTH EVERY DAY      Cardiovascular:  Angiotensin Receptor Blockers Passed - 08/03/2020  2:40 PM      Passed - Cr in normal range and within 180 days    Creatinine  Date Value Ref Range Status  06/17/2013 1.31 (H) 0.60 - 1.30 mg/dL Final   Creatinine, Ser  Date Value Ref Range Status  06/14/2020 1.10 0.61 - 1.24 mg/dL Final          Passed - K in normal range and within 180 days    Potassium  Date Value Ref Range Status  06/14/2020 3.7 3.5 - 5.1 mmol/L Final  06/17/2013 3.6 3.5 - 5.1 mmol/L Final          Passed - Patient is not pregnant      Passed - Last BP in normal range    BP Readings from Last 1 Encounters:  06/14/20 138/81          Passed - Valid encounter within last 6 months    Recent Outpatient Visits           1 month ago Annual physical exam   [facility] Volney American, PA-C   4 months ago Essential hypertension   [facility] Volney American, [place]   4 months ago Acute bilateral low back pain without sciatica   [place], [place], [place]   5 months ago Seasonal allergic rhinitis due to other allergic trigger   [facility] Volney American, [place]   6 months ago Seasonal allergic rhinitis due to other  allergic trigger   [place], [place], PA-C                DEXILANT 60 MG capsule [Pharmacy Med Name: [place] DR 60 MG CAPSULE] 90 capsule 1    Sig: TAKE 1 [place]      Gastroenterology: Proton Pump Inhibitors Passed - 08/03/2020  2:40 PM      Passed - Valid encounter within last 12 months    Recent Outpatient Visits           1 month ago Annual physical exam   [facility] Volney American, [place]   4 months ago Essential hypertension   [place], [place], [place]   4 months ago Acute bilateral low back pain without sciatica   [place], [place], [place]   5 months ago Seasonal allergic rhinitis due to other allergic trigger   [facility] Volney American, [place]   6 months ago Seasonal allergic rhinitis due to other allergic trigger   [facility] Volney American, [place]

## 2020-08-04 NOTE — Telephone Encounter (Signed)
Previous RL patient. Losartan is not due until late December, Dexilant is due though

## 2020-09-02 ENCOUNTER — Other Ambulatory Visit: Payer: Self-pay | Admitting: Gastroenterology

## 2020-09-02 DIAGNOSIS — F1010 Alcohol abuse, uncomplicated: Secondary | ICD-10-CM

## 2020-09-10 ENCOUNTER — Ambulatory Visit
Admission: RE | Admit: 2020-09-10 | Discharge: 2020-09-10 | Disposition: A | Discharge status: Home / Self Care | Payer: BC Managed Care – PPO | Source: Ambulatory Visit | Attending: Gastroenterology | Admitting: Gastroenterology

## 2020-09-10 ENCOUNTER — Other Ambulatory Visit: Payer: Self-pay

## 2020-09-10 DIAGNOSIS — F1010 Alcohol abuse, uncomplicated: Secondary | ICD-10-CM | POA: Insufficient documentation

## 2020-09-23 DIAGNOSIS — F411 Generalized anxiety disorder: Secondary | ICD-10-CM | POA: Insufficient documentation

## 2020-10-22 DIAGNOSIS — F1020 Alcohol dependence, uncomplicated: Secondary | ICD-10-CM | POA: Insufficient documentation

## 2020-10-29 ENCOUNTER — Other Ambulatory Visit: Payer: Self-pay | Admitting: Family Medicine

## 2020-10-29 DIAGNOSIS — I10 Essential (primary) hypertension: Secondary | ICD-10-CM

## 2020-10-29 NOTE — Telephone Encounter (Signed)
Requested medication (s) are due for refill today: yes  Requested medication (s) are on the active medication list: yes  Last refill:  xyzal 02/12/20  #90 3 refills, Amlodipine 04/01/20  #90-1 refill  Future visit scheduled: {No last [place] PA 06/05/20 CPE  Notes to clinic:  Patient has not been seen by another provider in office    Requested Prescriptions  Pending Prescriptions Disp Refills   levocetirizine (XYZAL) 5 MG tablet [Pharmacy Med Name: LEVOCETIRIZINE 5 MG TABLET] 90 tablet 3    Sig: TAKE 1 TABLET BY MOUTH EVERY DAY IN THE EVENING      Ear, Nose, and Throat:  Antihistamines Passed - 10/29/2020 11:25 AM      Passed - Valid encounter within last 12 months    Recent Outpatient Visits           4 months ago Annual physical exam   [facility] Volney American, [place]   7 months ago Essential hypertension   [place], [place], [place]   7 months ago Acute bilateral low back pain without sciatica   [place], [place], [place]   8 months ago Seasonal allergic rhinitis due to other allergic trigger   [facility] Volney American, [place]   8 months ago Seasonal allergic rhinitis due to other allergic trigger   [facility] Volney American, PA-C                  amLODipine ([place]) 5 MG tablet [Pharmacy Med Name: AMLODIPINE BESYLATE 5 MG TAB] 90 tablet 1    Sig: TAKE 1 TABLET BY MOUTH EVERY DAY      Cardiovascular:  Calcium Channel Blockers Passed - 10/29/2020 11:25 AM      Passed - Last BP in normal range    BP Readings from Last 1 Encounters:  06/14/20 138/81          Passed - Valid encounter within last 6 months    Recent Outpatient Visits           4 months ago Annual physical exam   [facility] Volney American, [place]   7 months ago Essential hypertension   [place], [place], [place]   7 months ago  Acute bilateral low back pain without sciatica   [place], [place], [place]   8 months ago Seasonal allergic rhinitis due to other allergic trigger   [facility] Volney American, [place]   8 months ago Seasonal allergic rhinitis due to other allergic trigger   [facility] Merrie Roof [place], [place]

## 2020-11-19 ENCOUNTER — Other Ambulatory Visit: Payer: Self-pay | Admitting: Family Medicine

## 2020-11-19 DIAGNOSIS — I10 Essential (primary) hypertension: Secondary | ICD-10-CM

## 2020-11-19 NOTE — Telephone Encounter (Signed)
   Notes to clinic:  Patient has been given new provider but has not seen yet Review for refill  Last filled by Merrie Roof   Requested Prescriptions  Pending Prescriptions Disp Refills   amLODipine ([place]) 5 MG tablet [Pharmacy Med Name: AMLODIPINE BESYLATE 5 MG TAB] 90 tablet 1    Sig: TAKE 1 TABLET BY MOUTH EVERY DAY      Cardiovascular:  Calcium Channel Blockers Passed - 11/19/2020 11:10 AM      Passed - Last BP in normal range    BP Readings from Last 1 Encounters:  06/14/20 138/81          Passed - Valid encounter within last 6 months    Recent Outpatient Visits           5 months ago Annual physical exam   [facility] Volney American, [place]   7 months ago Essential hypertension   [place], [place], [place]   8 months ago Acute bilateral low back pain without sciatica   [place], [place], [place]   9 months ago Seasonal allergic rhinitis due to other allergic trigger   [facility] Volney American, [place]   9 months ago Seasonal allergic rhinitis due to other allergic trigger   [facility] Volney American, PA-C                  levocetirizine (XYZAL) 5 MG tablet [Pharmacy Med Name: LEVOCETIRIZINE 5 MG TABLET] 90 tablet 3    Sig: TAKE 1 TABLET BY MOUTH EVERY DAY IN THE EVENING      Ear, Nose, and Throat:  Antihistamines Passed - 11/19/2020 11:10 AM      Passed - Valid encounter within last 12 months    Recent Outpatient Visits           5 months ago Annual physical exam   [facility] Volney American, [place]   7 months ago Essential hypertension   [place], [place], [place]   8 months ago Acute bilateral low back pain without sciatica   [place], [place], [place]   9 months ago Seasonal allergic rhinitis due to other allergic trigger   [facility] Volney American, [place]   9 months ago Seasonal allergic rhinitis due to other allergic trigger   [facility] Volney American, [place]

## 2020-11-19 NOTE — Telephone Encounter (Signed)
Patient is due this month for follow up.  30 day supply sent for patient to make an appointment.

## 2020-11-20 NOTE — Telephone Encounter (Signed)
Lvm to make this apt. 

## 2020-11-24 NOTE — Telephone Encounter (Signed)
Lvm to make this apt. 

## 2020-11-25 ENCOUNTER — Encounter: Payer: Self-pay | Admitting: Nurse Practitioner

## 2020-11-25 NOTE — Telephone Encounter (Signed)
Lvm to make this apt sent letter 

## 2020-12-17 ENCOUNTER — Other Ambulatory Visit: Payer: Self-pay | Admitting: Nurse Practitioner

## 2020-12-17 DIAGNOSIS — I10 Essential (primary) hypertension: Secondary | ICD-10-CM

## 2020-12-17 NOTE — Telephone Encounter (Signed)
Please see about getting patient scheduled for a follow up

## 2020-12-17 NOTE — Telephone Encounter (Signed)
Final courtesy refill.

## 2020-12-17 NOTE — Telephone Encounter (Signed)
Requested medication (s) are due for refill today: yes  Requested medication (s) are on the active medication list: yes  Last refill:  11/19/2020  Future visit scheduled: no  Notes to clinic:  Patient has not schedule appt  Review for another courtesy refill    Requested Prescriptions  Pending Prescriptions Disp Refills   amLODipine (NORVASC) 5 MG tablet [Pharmacy Med Name: AMLODIPINE BESYLATE 5 MG TAB] 30 tablet 0    Sig: TAKE 1 TABLET BY MOUTH EVERY DAY      Cardiovascular:  Calcium Channel Blockers Failed - 12/17/2020  2:34 PM      Failed - Valid encounter within last 6 months    Recent Outpatient Visits           6 months ago Annual physical exam   [facility] Volney American, [place]   8 months ago Essential hypertension   [place], [place], [place]   8 months ago Acute bilateral low back pain without sciatica   [place], [place], [place]   10 months ago Seasonal allergic rhinitis due to other allergic trigger   [facility] Volney American, [place]   10 months ago Seasonal allergic rhinitis due to other allergic trigger   [facility] Volney American, [place]                Passed - Last BP in normal range    BP Readings from Last 1 Encounters:  06/14/20 138/81

## 2020-12-17 NOTE — Telephone Encounter (Signed)
Can he have another refill

## 2020-12-18 NOTE — Telephone Encounter (Signed)
FYI Pt stated no longer a pt here at [facility].

## 2021-01-12 ENCOUNTER — Other Ambulatory Visit: Payer: Self-pay | Admitting: Nurse Practitioner

## 2021-01-12 DIAGNOSIS — I10 Essential (primary) hypertension: Secondary | ICD-10-CM

## 2021-01-12 NOTE — Telephone Encounter (Signed)
   Notes to clinic Has already had a curtesy refill and there is no upcoming appointment scheduled.

## 2021-01-20 ENCOUNTER — Other Ambulatory Visit (HOSPITAL_COMMUNITY): Payer: Self-pay | Admitting: Urology

## 2021-01-20 ENCOUNTER — Other Ambulatory Visit: Payer: Self-pay | Admitting: Urology

## 2021-01-20 DIAGNOSIS — R972 Elevated prostate specific antigen [PSA]: Secondary | ICD-10-CM

## 2021-01-24 ENCOUNTER — Other Ambulatory Visit: Payer: Self-pay | Admitting: Nurse Practitioner

## 2021-01-24 NOTE — Telephone Encounter (Signed)
Pt is no longer a pt of Jon Billings NP as of 12/17/20. Called [facility] on file and LM to cancel refill. Call back number of [phone] given in case of questions.

## 2021-01-24 NOTE — Addendum Note (Signed)
Addended by: Carlisle Beers on: 01/24/2021 09:44 AM   Modules accepted: Orders

## 2021-01-24 NOTE — Addendum Note (Signed)
Addended by: Carlisle Beers on: 01/24/2021 09:37 AM   Modules accepted: Orders

## 2021-01-24 NOTE — Telephone Encounter (Signed)
Requested Prescriptions  Pending Prescriptions Disp Refills  . levocetirizine (XYZAL) 5 MG tablet [Pharmacy Med Name: LEVOCETIRIZINE 5 MG TABLET] 90 tablet 0    Sig: TAKE 1 TABLET BY MOUTH EVERY DAY IN THE EVENING     Ear, Nose, and Throat:  Antihistamines Passed - 01/24/2021  9:00 AM      Passed - Valid encounter within last 12 months    Recent Outpatient Visits          7 months ago Annual physical exam   [facility] Volney American, [place]   9 months ago Essential hypertension   [place], [place], [place]   10 months ago Acute bilateral low back pain without sciatica   [place], [place], [place]   11 months ago Seasonal allergic rhinitis due to other allergic trigger   [facility] Volney American, [place]   11 months ago Seasonal allergic rhinitis due to other allergic trigger   [facility] Volney American, [place]

## 2021-02-03 ENCOUNTER — Ambulatory Visit
Admission: RE | Admit: 2021-02-03 | Discharge: 2021-02-03 | Disposition: A | Discharge status: Home / Self Care | Payer: BC Managed Care – PPO | Source: Ambulatory Visit | Attending: Urology | Admitting: Urology

## 2021-02-03 ENCOUNTER — Other Ambulatory Visit: Payer: Self-pay

## 2021-02-03 DIAGNOSIS — R972 Elevated prostate specific antigen [PSA]: Secondary | ICD-10-CM | POA: Diagnosis present

## 2021-02-03 MED ORDER — GADOBUTROL 1 MMOL/ML IV SOLN
10.0000 mL | Freq: Once | INTRAVENOUS | Status: AC | PRN
  Administered 2021-02-03: 10 mL via INTRAVENOUS

## 2021-03-16 NOTE — H&P (Signed)
NAME: Alexander Mendoza, Alexander Mendoza MEDICAL RECORD NO: [id number] ACCOUNT NO: [id number] DATE OF BIRTH: [birth year] FACILITY: [facility] LOCATION: [facility] PHYSICIAN: Otelia Limes. Yves Dill, MD  History and Physical   DATE OF ADMISSION: 03/25/2021  Same day surgery 03/25/2021  CHIEF COMPLAINT:  Elevated PSA and abnormal prostate gland MRI scan.  HISTORY OF PRESENT ILLNESS:  The patient is a 60 year old white male with history of elevated PSA of 10.4 ng/mL.  This was evaluated with MRI scan 02/03/2021 which revealed a 3.8 x 1.0 x 2.1 cm PI-RADS category 4 lesion of the peripheral zone.  He comes  in now for UroNav fusion biopsy of the prostate.  PAST MEDICAL HISTORY: ALLERGIES:  ALLERGIC TO BENAZEPRIL.  CURRENT MEDICATIONS:  Included Tadalafil, amlodipine, azelastine, vitamin B12, cyclobenzaprine, Dexilant, Flonase, folic acid, lactulose, losartan, magnesium citrate, multivitamins, nortriptyline, omega 3 fish oil, thiamine, vitamin E, Lexapro, Xyzal,  Seroquel and Fortesta.  PAST SURGICAL HISTORY:  Bilateral vasectomy.  PAST AND CURRENT MEDICAL CONDITIONS: 1.  Hypertension. 2.  GERD. 3.  Chronic joint pain. 4.  Erectile dysfunction. 5.  Hypogonadism. 6.  BPH. 7.  Peyronie's disease.  REVIEW OF SYSTEMS:  Patient has partial hearing loss.  He has exertional dyspnea.  He denies chest pain, diabetes or stroke.  He denies history of heart disease.  PHYSICAL EXAMINATION: VITAL SIGNS:  Height 5 feet 8 inches, weight 208 pounds. GENERAL:  Well-nourished white male in no acute distress. HEENT:  Sclerae clear. NECK:  No palpable masses. LYMPHATIC:  No palpable cervical or inguinal adenopathy. PULMONARY:  Lungs clear to auscultation. CARDIOVASCULAR:  Regular rhythm and rate. ABDOMEN:  Soft, nontender abdomen. BACK:  No CVA tenderness. GENITOURINARY:  Circumcised.  Testes smooth, nontender 18 mL size each.  Rectal exam 30 gram smooth, nontender prostate. NEUROMUSCULAR: Alert and oriented  x3.  IMPRESSION:  1.  Elevated PSA. 2.  PI-RADS category 4 lesion of the peripheral zone of the prostate.  PLAN:  UroNav fusion biopsy of prostate.   PUS D: 03/15/2021 3:12:59 pm T: 03/15/2021 3:55:00 pm  JOB: [id number]/ [id number]

## 2021-03-18 ENCOUNTER — Other Ambulatory Visit
Admission: RE | Admit: 2021-03-18 | Discharge: 2021-03-18 | Disposition: A | Discharge status: Home / Self Care | Payer: BC Managed Care – PPO | Source: Ambulatory Visit | Attending: Urology | Admitting: Urology

## 2021-03-18 ENCOUNTER — Other Ambulatory Visit: Payer: Self-pay

## 2021-03-18 NOTE — Patient Instructions (Addendum)
Your procedure is scheduled on:03/25/21  Report to the Registration Desk on the 1st floor of the [facility]. To find out your arrival time, please call [phone] between 1PM - 3PM on: 03/24/21  Report to [facility] for EKG on 03/23/21 at 8:00 am.  REMEMBER: Instructions that are not followed completely may result in serious medical risk, up to and including death; or upon the discretion of your surgeon and anesthesiologist your surgery may need to be rescheduled.  Do not eat food after midnight the night before surgery.  No gum chewing, lozengers or hard candies.  You may however, drink CLEAR liquids up to 2 hours before you are scheduled to arrive for your surgery. Do not drink anything within 2 hours of your scheduled arrival time.  Clear liquids include: - water  - apple juice without pulp - gatorade (not RED, PURPLE, OR BLUE) - black coffee or tea (Do NOT add milk or creamers to the coffee or tea) Do NOT drink anything that is not on this list.   TAKE THESE MEDICATIONS THE MORNING OF SURGERY WITH A SIP OF WATER:  - amLODipine (NORVASC) 5 MG tablet - DEXILANT 60 MG capsule - escitalopram (LEXAPRO) 20 MG tablet - naltrexone (DEPADE) 50 MG tablet  Use inhalers albuterol (PROVENTIL HFA;VENTOLIN HFA) 108 (90 Base) MCG/ACT inhaler on the day of surgery and bring to the hospital.  One week prior to surgery: Stop Anti-inflammatories (NSAIDS) such as Advil, Aleve, Ibuprofen, Motrin, Naproxen, Naprosyn and Aspirin based products such as Excedrin, Goodys Powder, BC Powder.  Stop ANY OVER THE COUNTER supplements until after surgery.Marland Kitchen  No Alcohol for 24 hours before or after surgery.  No Smoking including e-cigarettes for 24 hours prior to surgery.  No chewable tobacco products for at least 6 hours prior to surgery.  No nicotine patches on the day of surgery.  Do not use any "recreational" drugs for at least a week prior to your surgery.  Please be advised that the  combination of cocaine and anesthesia may have negative outcomes, up to and including death. If you test positive for cocaine, your surgery will be cancelled.  On the morning of surgery brush your teeth with toothpaste and water, you may rinse your mouth with mouthwash if you wish. Do not swallow any toothpaste or mouthwash.  Do not wear jewelry, make-up, hairpins, clips or nail polish.  Do not wear lotions, powders, or perfumes.   Do not shave body from the neck down 48 hours prior to surgery just in case you cut yourself which could leave a site for infection.  Also, freshly shaved skin may become irritated if using the CHG soap.  Contact lenses, hearing aids and dentures may not be worn into surgery.  Do not bring valuables to the hospital. [facility] is not responsible for any missing/lost belongings or valuables.   Fleets enema or Magnesium Citrate as directed.  Notify your doctor if there is any change in your medical condition (cold, fever, infection).  Wear comfortable clothing (specific to your surgery type) to the hospital.  Plan for stool softeners for home use; pain medications have a tendency to cause constipation. You can also help prevent constipation by eating foods high in fiber such as fruits and vegetables and drinking plenty of fluids as your diet allows.  After surgery, you can help prevent lung complications by doing breathing exercises.  Take deep breaths and cough every 1-2 hours. Your doctor may order a device called an Chiropodist  to help you take deep breaths. When coughing or sneezing, hold a pillow firmly against your incision with both hands. This is called "splinting." Doing this helps protect your incision. It also decreases belly discomfort.  If you are being admitted to the hospital overnight, leave your suitcase in the car. After surgery it may be brought to your room.  If you are being discharged the day of surgery, you will not be  allowed to drive home. You will need a responsible adult (18 years or older) to drive you home and stay with you that night.   If you are taking public transportation, you will need to have a responsible adult (18 years or older) with you. Please confirm with your physician that it is acceptable to use public transportation.   Please call the [place] Dept. at [phone] if you have any questions about these instructions.  Surgery Visitation Policy:  Patients undergoing a surgery or procedure may have one family member or support person with them as long as that person is not COVID-19 positive or experiencing its symptoms.  That person may remain in the waiting area during the procedure.  Inpatient Visitation:    Visiting hours are 7 a.m. to 8 p.m. Inpatients will be allowed two visitors daily. The visitors may change each day during the patient's stay. No visitors under the age of 54. Any visitor under the age of 11 must be accompanied by an adult. The visitor must pass COVID-19 screenings, use hand sanitizer when entering and exiting the patient's room and wear a mask at all times, including in the patient's room. Patients must also wear a mask when staff or their visitor are in the room. Masking is required regardless of vaccination status.

## 2021-03-23 ENCOUNTER — Other Ambulatory Visit: Payer: Self-pay | Admitting: Nurse Practitioner

## 2021-03-23 ENCOUNTER — Other Ambulatory Visit
Admission: RE | Admit: 2021-03-23 | Discharge: 2021-03-23 | Disposition: A | Discharge status: Home / Self Care | Payer: BC Managed Care – PPO | Source: Ambulatory Visit | Attending: Urology | Admitting: Urology

## 2021-03-23 ENCOUNTER — Other Ambulatory Visit: Payer: Self-pay

## 2021-03-23 DIAGNOSIS — R9349 Abnormal radiologic findings on diagnostic imaging of other urinary organs: Secondary | ICD-10-CM | POA: Diagnosis not present

## 2021-03-23 DIAGNOSIS — Z01818 Encounter for other preprocedural examination: Secondary | ICD-10-CM | POA: Insufficient documentation

## 2021-03-23 DIAGNOSIS — I10 Essential (primary) hypertension: Secondary | ICD-10-CM | POA: Insufficient documentation

## 2021-03-23 DIAGNOSIS — Z79899 Other long term (current) drug therapy: Secondary | ICD-10-CM | POA: Diagnosis not present

## 2021-03-23 DIAGNOSIS — R972 Elevated prostate specific antigen [PSA]: Secondary | ICD-10-CM | POA: Diagnosis not present

## 2021-03-23 DIAGNOSIS — Z888 Allergy status to other drugs, medicaments and biological substances status: Secondary | ICD-10-CM | POA: Diagnosis not present

## 2021-03-23 DIAGNOSIS — Z79890 Hormone replacement therapy (postmenopausal): Secondary | ICD-10-CM | POA: Diagnosis not present

## 2021-03-23 LAB — PROTIME-INR
INR: 1 (ref 0.8–1.2)
Prothrombin Time: 13.3 seconds (ref 11.4–15.2)

## 2021-03-23 LAB — APTT: aPTT: 30 seconds (ref 24–36)

## 2021-03-25 ENCOUNTER — Encounter: Payer: Self-pay | Admitting: Urology

## 2021-03-25 ENCOUNTER — Ambulatory Visit
Admission: RE | Admit: 2021-03-25 | Discharge: 2021-03-25 | Disposition: A | Discharge status: Home / Self Care | Payer: BC Managed Care – PPO | Attending: Urology | Admitting: Urology

## 2021-03-25 ENCOUNTER — Ambulatory Visit: Payer: BC Managed Care – PPO | Admitting: Anesthesiology

## 2021-03-25 ENCOUNTER — Other Ambulatory Visit: Payer: Self-pay

## 2021-03-25 ENCOUNTER — Encounter
Admission: RE | Disposition: A | Discharge status: Home / Self Care | Payer: Self-pay | Source: Home / Self Care | Attending: Urology

## 2021-03-25 DIAGNOSIS — R972 Elevated prostate specific antigen [PSA]: Secondary | ICD-10-CM | POA: Diagnosis not present

## 2021-03-25 DIAGNOSIS — Z888 Allergy status to other drugs, medicaments and biological substances status: Secondary | ICD-10-CM | POA: Insufficient documentation

## 2021-03-25 DIAGNOSIS — R9349 Abnormal radiologic findings on diagnostic imaging of other urinary organs: Secondary | ICD-10-CM | POA: Insufficient documentation

## 2021-03-25 DIAGNOSIS — Z79890 Hormone replacement therapy (postmenopausal): Secondary | ICD-10-CM | POA: Insufficient documentation

## 2021-03-25 DIAGNOSIS — Z79899 Other long term (current) drug therapy: Secondary | ICD-10-CM | POA: Insufficient documentation

## 2021-03-25 HISTORY — PX: PROSTATE BIOPSY: SHX241

## 2021-03-25 SURGERY — BIOPSY, PROSTATE
Anesthesia: General

## 2021-03-25 MED ORDER — PROPOFOL 500 MG/50ML IV EMUL
INTRAVENOUS | Status: DC | PRN
  Administered 2021-03-25: 160 ug/kg/min via INTRAVENOUS

## 2021-03-25 MED ORDER — SODIUM CHLORIDE 0.9 % IV SOLN
1.0000 g | Freq: Once | INTRAVENOUS | Status: AC
  Administered 2021-03-25: 1 g via INTRAVENOUS

## 2021-03-25 MED ORDER — ORAL CARE MOUTH RINSE: 15.0000 mL | Freq: Once | OROMUCOSAL | Status: AC

## 2021-03-25 MED ORDER — CHLORHEXIDINE GLUCONATE 0.12 % MT SOLN: 15.0000 mL | Freq: Once | OROMUCOSAL | Status: AC

## 2021-03-25 MED ORDER — FLEET ENEMA 7-19 GM/118ML RE ENEM: 1.0000 | ENEMA | Freq: Once | RECTAL | Status: DC

## 2021-03-25 MED ORDER — MIDAZOLAM HCL 2 MG/2ML IJ SOLN
INTRAMUSCULAR | Status: AC
  Filled 2021-03-25: qty 2

## 2021-03-25 MED ORDER — MIDAZOLAM HCL 2 MG/2ML IJ SOLN
INTRAMUSCULAR | Status: DC | PRN
  Administered 2021-03-25: 2 mg via INTRAVENOUS

## 2021-03-25 MED ORDER — CHLORHEXIDINE GLUCONATE 0.12 % MT SOLN
OROMUCOSAL | Status: AC
  Administered 2021-03-25: 15 mL via OROMUCOSAL
  Filled 2021-03-25: qty 15

## 2021-03-25 MED ORDER — GENTAMICIN IN SALINE 1.6-0.9 MG/ML-% IV SOLN
80.0000 mg | Freq: Once | INTRAVENOUS | Status: AC
  Administered 2021-03-25: 80 mg via INTRAVENOUS
  Filled 2021-03-25: qty 50

## 2021-03-25 MED ORDER — LACTATED RINGERS IV SOLN: INTRAVENOUS | Status: DC

## 2021-03-25 MED ORDER — FENTANYL CITRATE (PF) 100 MCG/2ML IJ SOLN
INTRAMUSCULAR | Status: AC
  Filled 2021-03-25: qty 2

## 2021-03-25 MED ORDER — FENTANYL CITRATE (PF) 100 MCG/2ML IJ SOLN
INTRAMUSCULAR | Status: DC | PRN
  Administered 2021-03-25 (×3): 25 ug via INTRAVENOUS

## 2021-03-25 MED ORDER — PROPOFOL 10 MG/ML IV BOLUS
INTRAVENOUS | Status: DC | PRN
  Administered 2021-03-25: 40 mg via INTRAVENOUS
  Administered 2021-03-25: 10 mg via INTRAVENOUS

## 2021-03-25 MED ORDER — LIDOCAINE HCL (PF) 2 % IJ SOLN
INTRAMUSCULAR | Status: DC | PRN
  Administered 2021-03-25: 100 mg via INTRADERMAL

## 2021-03-25 MED ORDER — ACETAMINOPHEN 160 MG/5ML PO SOLN
325.0000 mg | ORAL | Status: DC | PRN
  Filled 2021-03-25: qty 20.3

## 2021-03-25 MED ORDER — ACETAMINOPHEN 325 MG PO TABS: 325.0000 mg | ORAL_TABLET | ORAL | Status: DC | PRN

## 2021-03-25 MED ORDER — PROPOFOL 500 MG/50ML IV EMUL
INTRAVENOUS | Status: AC
  Filled 2021-03-25: qty 50

## 2021-03-25 MED ORDER — ONDANSETRON HCL 4 MG/2ML IJ SOLN
4.0000 mg | Freq: Once | INTRAMUSCULAR | Status: DC | PRN
Start: 2021-03-25 — End: 2021-03-25

## 2021-03-25 MED ORDER — SODIUM CHLORIDE 0.9 % IV SOLN
INTRAVENOUS | Status: AC
  Filled 2021-03-25: qty 10

## 2021-03-25 MED ORDER — HYDROCODONE-ACETAMINOPHEN 7.5-325 MG PO TABS
1.0000 | ORAL_TABLET | Freq: Once | ORAL | Status: DC | PRN
Start: 2021-03-25 — End: 2021-03-25

## 2021-03-25 SURGICAL SUPPLY — 23 items
COVER MAYO STAND REUSABLE (DRAPES) ×3 IMPLANT
COVER TRANSDUCER ULTRASOUND (MISCELLANEOUS) ×2 IMPLANT
DRSG TELFA 3X8 NADH (GAUZE/BANDAGES/DRESSINGS) ×3 IMPLANT
GLOVE SURG ENC MOIS LTX SZ7 (GLOVE) ×6 IMPLANT
GUIDE NDL ENDOCAV 16-18 CVR (NEEDLE) IMPLANT
GUIDE NDL URONAV ULTRASND S (MISCELLANEOUS) IMPLANT
GUIDE NEEDLE ENDOCAV 16-18 CVR (NEEDLE) IMPLANT
GUIDE NEEDLE URONAV ULTRASND S (MISCELLANEOUS) ×1 IMPLANT
INST BIOPSY MAXCORE 18GX25 (NEEDLE) ×3 IMPLANT
LASER XPS ACCESS DROP OFF FEE (MISCELLANEOUS) ×2 IMPLANT
MANIFOLD NEPTUNE II (INSTRUMENTS) ×1 IMPLANT
NDL GUIDE BIOPSY 644068 (NEEDLE) IMPLANT
NEEDLE GUIDE BIOPSY 644068 (NEEDLE) IMPLANT
PAD DRESSING TELFA 3X8 NADH (GAUZE/BANDAGES/DRESSINGS) IMPLANT
PROBE BIOSP ALOKA ALPHA6 PROST (MISCELLANEOUS) ×2 IMPLANT
PROBE URONAV BK 8808E 8818 HLD (MISCELLANEOUS) IMPLANT
STRAP SAFETY 5IN WIDE (MISCELLANEOUS) ×3 IMPLANT
SURGILUBE 2OZ TUBE FLIPTOP (MISCELLANEOUS) ×3 IMPLANT
TOWEL OR 17X26 4PK STRL BLUE (TOWEL DISPOSABLE) ×3 IMPLANT
URONAV BK 8808E 8818 PROBE HLD (MISCELLANEOUS) ×3
URONAV MRI FUSION TWO PATIENTS (MISCELLANEOUS) ×2 IMPLANT
URONAV ULTRASOUND (MISCELLANEOUS) ×2 IMPLANT
URONAV ULTRASOUND NDL GUIDE S (MISCELLANEOUS) ×3

## 2021-03-25 NOTE — Anesthesia Postprocedure Evaluation (Signed)
Anesthesia Post Note  Patient: Alexander Mendoza  Procedure(s) Performed: PROSTATE BIOPSY URONAV  Patient location during evaluation: PACU Anesthesia Type: General Level of consciousness: awake and alert Pain management: pain level controlled Vital Signs Assessment: post-procedure vital signs reviewed and stable Respiratory status: spontaneous breathing, nonlabored ventilation and respiratory function stable Cardiovascular status: blood pressure returned to baseline and stable Postop Assessment: no apparent nausea or vomiting Anesthetic complications: no   No notable events documented.   Last Vitals:  Vitals:   03/25/21 1130 03/25/21 1142  BP: (!) 165/92 (!) (P) 173/85  Pulse: 65   Resp: 14 (P) 16  Temp:  (P) 36.8 C  SpO2: 97% (P) 97%    Last Pain:  Vitals:   03/25/21 1142  TempSrc: (P) Temporal  PainSc: (P) 1                  Alphonsus Sias

## 2021-03-25 NOTE — Op Note (Signed)
Preoperative diagnosis: 1.  Elevated PSA (C61)                                           2.  Abnormal prostate gland MRI scan (N42.89)  Postoperative diagnosis: Same  Procedure: Transrectal Uronav fusion biopsy of the prostate gland                      ([facility] [place], [id number])  Surgeon: Otelia Limes. Yves Dill MD  Anesthesia: General  Indications:See the history and physical. After informed consent the above procedure(s) were requested     Technique and findings: After adequate general anesthesia been obtained the patient was placed into the left lateral decubitus position and DRE was performed.  Rectal vault was clear.  The ultrasound probe was placed and images acquired.  The region of interest was identified and 4 core biopsies obtained from this location.  At this point standard 12 core systematic biopsies were performed.  The ultrasound probe was removed.  Blood loss was minimal.  The procedure was then terminated and patient transferred to the recovery room in stable condition.

## 2021-03-25 NOTE — H&P (Signed)
Date of Initial H&P: 03/15/21  History reviewed, patient examined, no change in status, stable for surgery.

## 2021-03-25 NOTE — Anesthesia Procedure Notes (Signed)
Date/Time: 03/25/2021 10:30 AM Performed by: Allean Found, CRNA Pre-anesthesia Checklist: Emergency Drugs available, Suction available, Patient being monitored, Timeout performed and Patient identified Patient Re-evaluated:Patient Re-evaluated prior to induction Oxygen Delivery Method: Nasal cannula Placement Confirmation: positive ETCO2

## 2021-03-25 NOTE — Discharge Instructions (Addendum)
See aboveAMBULATORY SURGERY  DISCHARGE INSTRUCTIONS   The drugs that you were given will stay in your system until tomorrow so for the next 24 hours you should not:  Drive an automobile Make any legal decisions Drink any alcoholic beverage   You may resume regular meals tomorrow.  Today it is better to start with liquids and gradually work up to solid foods.  You may eat anything you prefer, but it is better to start with liquids, then soup and crackers, and gradually work up to solid foods.   Please notify your doctor immediately if you have any unusual bleeding, trouble breathing, redness and pain at the surgery site, drainage, fever, or pain not relieved by medication.    Please contact your physician with any problems or Same Day Surgery at [phone], Monday through Friday 6 am to 4 pm, or [place] [facility] number at [phone].  

## 2021-03-25 NOTE — Anesthesia Preprocedure Evaluation (Addendum)
Anesthesia Evaluation  Patient identified by MRN, date of birth, ID band Patient awake    Reviewed: Allergy & Precautions, H&P , NPO status , reviewed documented beta blocker date and time   Airway Mallampati: III  TM Distance: >3 FB Neck ROM: full    Dental  (+) Missing, Caps, Dental Advidsory Given   Pulmonary    Pulmonary exam normal        Cardiovascular hypertension, Normal cardiovascular exam     Neuro/Psych PSYCHIATRIC DISORDERS    GI/Hepatic GERD  Medicated and Controlled,(+)     substance abuse  alcohol use,   Endo/Other    Renal/GU Renal disease     Musculoskeletal   Abdominal   Peds  Hematology   Anesthesia Other Findings Past Medical History: ETOH abuse  No date: Allergy No date: Chronic kidney disease No date: GERD (gastroesophageal reflux disease) No date: Hyperlipidemia No date: Hypertension  Past Surgical History: No date: COLONOSCOPY No date: VASECTOMY No date: WISDOM TOOTH EXTRACTION     Reproductive/Obstetrics                            Anesthesia Physical Anesthesia Plan  ASA: 2  Anesthesia Plan: General   Post-op Pain Management:    Induction: Intravenous  PONV Risk Score and Plan: Treatment may vary due to age or medical condition and TIVA  Airway Management Planned: Nasal Cannula and Natural Airway  Additional Equipment:   Intra-op Plan:   Post-operative Plan:   Informed Consent: I have reviewed the patients History and Physical, chart, labs and discussed the procedure including the risks, benefits and alternatives for the proposed anesthesia with the patient or authorized representative who has indicated his/her understanding and acceptance.     Dental Advisory Given  Plan Discussed with: CRNA  Anesthesia Plan Comments:        Anesthesia Quick Evaluation

## 2021-03-25 NOTE — Transfer of Care (Signed)
Immediate Anesthesia Transfer of Care Note  Patient: Alexander Mendoza  Procedure(s) Performed: PROSTATE BIOPSY URONAV  Patient Location: PACU  Anesthesia Type:General  Level of Consciousness: awake, alert  and oriented  Airway & Oxygen Therapy: Patient Spontanous Breathing and Patient connected to nasal cannula oxygen  Post-op Assessment: Report given to RN and Post -op Vital signs reviewed and stable  Post vital signs: Reviewed and stable  Last Vitals:  Vitals Value Taken Time  BP 138/96 03/25/21 1110  Temp    Pulse 71 03/25/21 1114  Resp 16 03/25/21 1114  SpO2 98 % 03/25/21 1114  Vitals shown include unvalidated device data.  Last Pain:  Vitals:   03/25/21 0944  TempSrc: Tympanic  PainSc: 0-No pain         Complications: No notable events documented.

## 2021-03-26 ENCOUNTER — Encounter: Payer: Self-pay | Admitting: Urology

## 2021-03-26 LAB — SURGICAL PATHOLOGY

## 2021-05-10 DIAGNOSIS — R972 Elevated prostate specific antigen [PSA]: Secondary | ICD-10-CM | POA: Insufficient documentation

## 2021-05-27 ENCOUNTER — Other Ambulatory Visit: Payer: Self-pay | Admitting: Nurse Practitioner

## 2021-05-27 DIAGNOSIS — I10 Essential (primary) hypertension: Secondary | ICD-10-CM

## 2021-11-10 DIAGNOSIS — R7303 Prediabetes: Secondary | ICD-10-CM | POA: Insufficient documentation

## 2022-10-12 ENCOUNTER — Ambulatory Visit: Payer: BC Managed Care – PPO | Admitting: Urology

## 2022-10-12 ENCOUNTER — Encounter: Payer: Self-pay | Admitting: Urology

## 2022-10-12 VITALS — BP 125/84 | HR 72 | Ht 68.0 in | Wt 250.0 lb

## 2022-10-12 DIAGNOSIS — Z87898 Personal history of other specified conditions: Secondary | ICD-10-CM

## 2022-10-12 DIAGNOSIS — E291 Testicular hypofunction: Secondary | ICD-10-CM

## 2022-10-12 DIAGNOSIS — R972 Elevated prostate specific antigen [PSA]: Secondary | ICD-10-CM | POA: Diagnosis not present

## 2022-10-12 DIAGNOSIS — N401 Enlarged prostate with lower urinary tract symptoms: Secondary | ICD-10-CM

## 2022-10-12 DIAGNOSIS — N5201 Erectile dysfunction due to arterial insufficiency: Secondary | ICD-10-CM

## 2022-10-16 ENCOUNTER — Encounter: Payer: Self-pay | Admitting: Urology

## 2022-10-16 NOTE — Progress Notes (Signed)
10/12/2022 10:39 AM   Alexander Mendoza Aug 22, 1961 [id number]  Referring provider: Kirk Ruths, MD [facility] [place],  [place] [address]  Chief Complaint  Patient presents with   Hypogonadism   Erectile Dysfunction    Urologic history: 1.  Hypogonadism Symptoms decreased libido, tiredness, fatigue Compounded testosterone cream 20% 1 cc daily  2.  Erectile dysfunction Tadalafil 15 mg prn  3.  History of elevated PSA PSA 10.13 March 2021 MRI PI-RADS 4 lesion MR fusion biopsy 03/2019 with benign pathology  4.  BPH Moderate LUTS   HPI: Alexander Mendoza is a 62 y.o. male presents to establish urologic care.  Former patient of Dr. Yves Dill who recently retired.  Urologic problem list as above Last saw Dr. Yves Dill October 2023 with stable symptoms Labs at that visit: Testosterone 263; PSA 2.3; hematocrit 45.1   PMH: Past Medical History:  Diagnosis Date   Allergy    Chronic kidney disease    GERD (gastroesophageal reflux disease)    Hyperlipidemia    Hypertension     Surgical History: Past Surgical History:  Procedure Laterality Date   COLONOSCOPY     PROSTATE BIOPSY N/A 03/25/2021   Procedure: PROSTATE BIOPSY Vernelle Emerald;  Surgeon: Royston Cowper, MD;  Location: [facility] ORS;  Service: Urology;  Laterality: N/A;   VASECTOMY     WISDOM TOOTH EXTRACTION      Home Medications:  Allergies as of 10/12/2022       Reactions   Benazepril Cough        Medication List        Accurate as of October 12, 2022 11:59 PM. If you have any questions, ask your nurse or doctor.          STOP taking these medications    cyclobenzaprine 10 MG tablet Commonly known as: FLEXERIL Stopped by: Abbie Sons, MD       TAKE these medications    albuterol 108 (90 Base) MCG/ACT inhaler Commonly known as: VENTOLIN HFA Inhale 1 puff into the lungs every 6 (six) hours as needed for wheezing or shortness of breath.   amLODipine 5 MG  tablet Commonly known as: NORVASC TAKE 1 TABLET BY MOUTH EVERY DAY   ARIPiprazole 10 MG tablet Commonly known as: ABILIFY Take 10 mg by mouth daily.   azelastine 0.1 % nasal spray Commonly known as: ASTELIN Place 1 spray into both nostrils daily as needed for rhinitis.   cyanocobalamin 1000 MCG tablet Take 1,000 mcg by mouth daily.   Dexilant 60 MG capsule Generic drug: dexlansoprazole TAKE 1 CAPSULE BY MOUTH EVERY DAY What changed: how much to take   escitalopram 20 MG tablet Commonly known as: LEXAPRO Take 20 mg by mouth daily.   Fish Oil 1200 MG Caps Take 1,200 mg by mouth daily.   fluticasone 50 MCG/ACT nasal spray Commonly known as: FLONASE Place 2 sprays into both nostrils daily as needed for allergies or rhinitis.   folic acid 630 MCG tablet Commonly known as: FOLVITE Take 800 mcg by mouth daily.   lactulose 10 GM/15ML solution Commonly known as: CHRONULAC Take by mouth daily. 2-4 mLs   levocetirizine 5 MG tablet Commonly known as: XYZAL Take 5 mg by mouth every evening.   losartan 100 MG tablet Commonly known as: COZAAR TAKE 1 TABLET BY MOUTH EVERY DAY   MAGNESIUM CITRATE PO Take 160 mg by mouth daily.   Multi-Vitamins Tabs Take 1 tablet by mouth daily.  naltrexone 50 MG tablet Commonly known as: DEPADE Take 50 mg by mouth daily.   nortriptyline 10 MG capsule Commonly known as: PAMELOR Take 10 mg by mouth at bedtime.   tadalafil 10 MG tablet Commonly known as: Cialis Take 1 tablet (10 mg total) by mouth daily as needed for erectile dysfunction.   Testosterone 20 % Crea Apply 1 application topically daily.   Vitamin D (Cholecalciferol) 25 MCG (1000 UT) Tabs Take 1,000 Units by mouth daily.   Vitamin E 180 MG (400 UNIT) Caps Take 400 Units by mouth daily.        Allergies:  Allergies  Allergen Reactions   Benazepril Cough    Family History: Family History  Problem Relation Age of Onset   Cancer Mother        Breast    Hypertension Father    Cancer Brother        Testicular    Social History:  reports that he has never smoked. His smokeless tobacco use includes snuff. He reports current alcohol use. He reports that he does not use drugs.   Physical Exam: BP 125/84   Pulse 72   Ht '5\' 8"'$  (1.727 m)   Wt 250 lb (113.4 kg)   BMI 38.01 kg/m   Constitutional:  Alert and oriented, No acute distress. HEENT: [place] AT Respiratory: Normal respiratory effort, no increased work of breathing. GI: Abdomen is soft, nontender, nondistended, no abdominal masses GU: Prostate 40 g, prominent right apex which has previously been negative Skin: No rashes, bruises or suspicious lesions. Neurologic: Grossly intact, no focal deficits, moving all 4 extremities. Psychiatric: Normal mood and affect.   Assessment & Plan:    1.  Hypogonadism He did not need a refill at this time Due for labs in April 2024  2.  Erectile dysfunction Stable  3.  History elevated PSA Stable  4.  BPH with LUTS Stable   Abbie Sons, MD  [facility] [address] [place], [place] [address] [phone]

## 2022-11-26 ENCOUNTER — Encounter: Payer: Self-pay | Admitting: Urology

## 2022-11-29 ENCOUNTER — Telehealth: Payer: Self-pay | Admitting: *Deleted

## 2022-11-29 NOTE — Telephone Encounter (Signed)
The pharmacy is [facility] [phone] ,    Patient is wanting a refill on testosterone cream .

## 2022-11-30 MED ORDER — AMBULATORY NON FORMULARY MEDICATION: 0 refills | Status: AC

## 2022-11-30 NOTE — Addendum Note (Signed)
Addended by: Kyra Manges on: 11/30/2022 04:18 PM   Modules accepted: Orders

## 2022-11-30 NOTE — Telephone Encounter (Signed)
I called [facility] at [phone], she said the rx is  Testosterone 20% cream 10m for 90 days Apply 1 ml to upper arms or thighs or behind knees daily.

## 2022-11-30 NOTE — Telephone Encounter (Signed)
RX printed and faxed. 

## 2023-01-22 IMAGING — MR MR PROSTATE WO/W CM
56 series · 56 of 56 positions shown · IV contrast (10ml Gadavist)
Comparison: None.

CLINICAL DATA: Rapid elevation in PSA, recently 10.4 on 01/20/2021.

EXAM:
MR PROSTATE WITHOUT AND WITH CONTRAST
TECHNIQUE: Multiplanar multisequence MRI images were obtained of the pelvis
centered about the prostate. Pre and post contrast images were
obtained.
CONTRAST:  10mL GADAVIST GADOBUTROL 1 MMOL/ML IV SOLN

[Series 3: ax in&out whole · axial · 6.0mm · 0.74mm/px · 1 of 35 slices shown (1 of 2)]
[im 1/35]
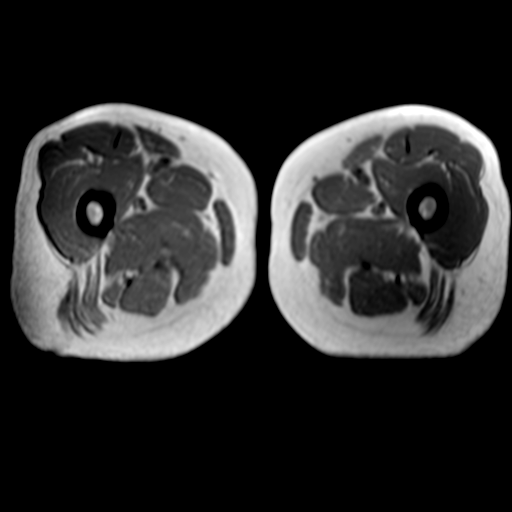

[Series 3: ax in&out whole · axial · 6.0mm · 0.74mm/px · 1 of 35 slices shown (2 of 2)]
[im 1/35]
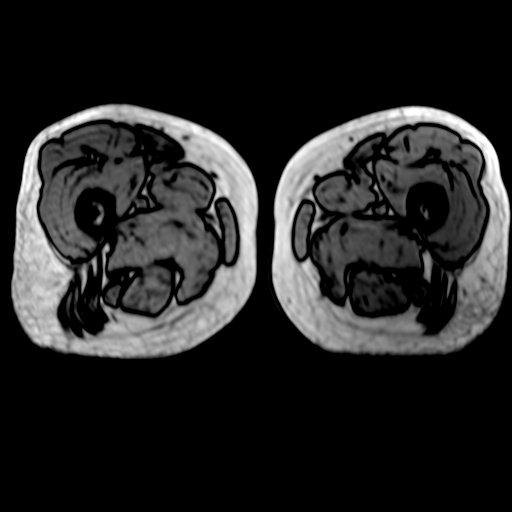

[Series 4: T2 · axial · 3.0mm · 0.56mm/px · 1 of 27 slices shown (1 of 4)]
[im 1/27]
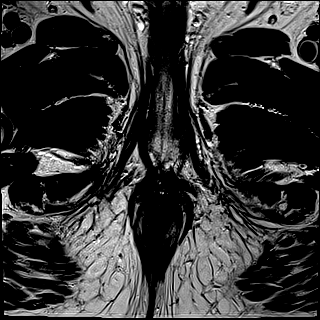

[Series 5: T2 · coronal · 3.0mm · 0.70mm/px · 1 of 37 slices shown (2 of 4)]
[im 1/37]
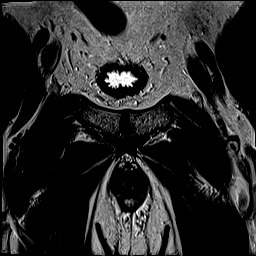

[Series 6: DWI · axial · 3.0mm · 0.86mm/px · 1 of 90 slices shown (1 of 3)]
[im 1/90]
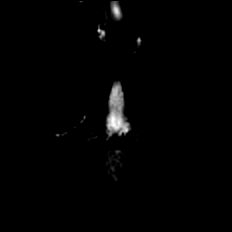

[Series 7: DWI · axial · 3.0mm · 0.86mm/px · 1 of 30 slices shown (2 of 3)]
[im 1/30]
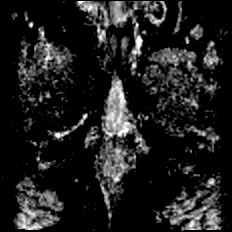

[Series 8: DWI · axial · 3.0mm · 0.86mm/px · 1 of 30 slices shown (3 of 3)]
[im 1/30]
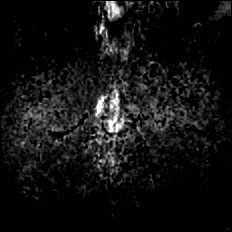

[Series 9: T2 · axial · 1.0mm · 1.04mm/px · 1 of 88 slices shown (3 of 4)]
[im 1/88]
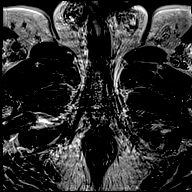

[Series 10: T2 · axial · 1.0mm · 1.04mm/px · 1 of 88 slices shown (4 of 4)]
[im 1/88]
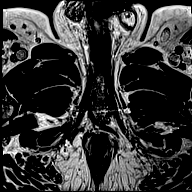

[Series 11: T1 · axial · 3.0mm · 1.15mm/px · 1 of 28 slices shown (1 of 47)]
[im 1/28]
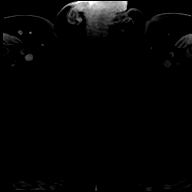

[Series 12: T1 · axial · 3.0mm · 1.15mm/px · 1 of 28 slices shown (2 of 47)]
[im 1/28]
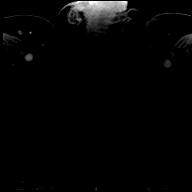

[Series 13: T1 · axial · 3.0mm · 1.15mm/px · 1 of 28 slices shown (3 of 47)]
[im 1/28]
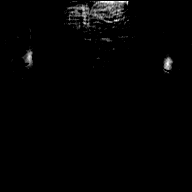

[Series 14: T1 · axial · 3.0mm · 1.15mm/px · 1 of 28 slices shown (4 of 47)]
[im 1/28]
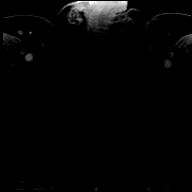

[Series 15: T1 · axial · 3.0mm · 1.15mm/px · 1 of 28 slices shown (5 of 47)]
[im 1/28]
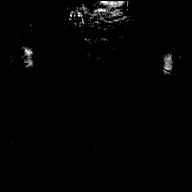

[Series 16: T1 · axial · 3.0mm · 1.15mm/px · 1 of 28 slices shown (6 of 47)]
[im 1/28]
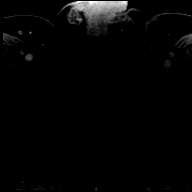

[Series 17: T1 · axial · 3.0mm · 1.15mm/px · 1 of 28 slices shown (7 of 47)]
[im 1/28]
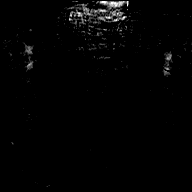

[Series 18: T1 · axial · 3.0mm · 1.15mm/px · 1 of 28 slices shown (8 of 47)]
[im 1/28]
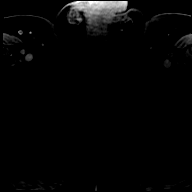

[Series 19: T1 · axial · 3.0mm · 1.15mm/px · 1 of 28 slices shown (9 of 47)]
[im 1/28]
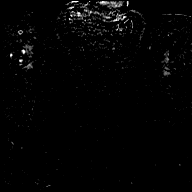

[Series 20: T1 · axial · 3.0mm · 1.15mm/px · 1 of 28 slices shown (10 of 47)]
[im 1/28]
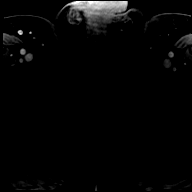

[Series 21: T1 · axial · 3.0mm · 1.15mm/px · 1 of 28 slices shown (11 of 47)]
[im 1/28]
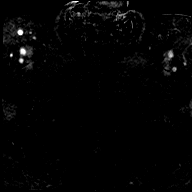

[Series 22: T1 · axial · 3.0mm · 1.15mm/px · 1 of 28 slices shown (12 of 47)]
[im 1/28]
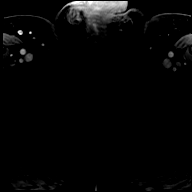

[Series 23: T1 · axial · 3.0mm · 1.15mm/px · 1 of 28 slices shown (13 of 47)]
[im 1/28]
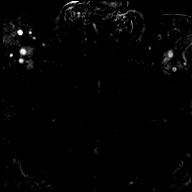

[Series 24: T1 · axial · 3.0mm · 1.15mm/px · 1 of 28 slices shown (14 of 47)]
[im 1/28]
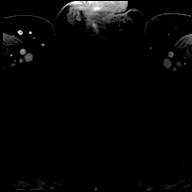

[Series 25: T1 · axial · 3.0mm · 1.15mm/px · 1 of 28 slices shown (15 of 47)]
[im 1/28]
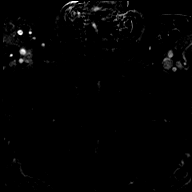

[Series 26: T1 · axial · 3.0mm · 1.15mm/px · 1 of 28 slices shown (16 of 47)]
[im 1/28]
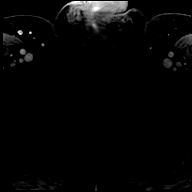

[Series 27: T1 · axial · 3.0mm · 1.15mm/px · 1 of 28 slices shown (17 of 47)]
[im 1/28]
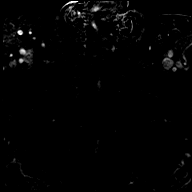

[Series 28: T1 · axial · 3.0mm · 1.15mm/px · 1 of 28 slices shown (18 of 47)]
[im 1/28]
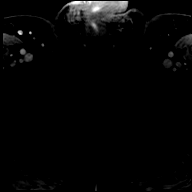

[Series 29: T1 · axial · 3.0mm · 1.15mm/px · 1 of 28 slices shown (19 of 47)]
[im 1/28]
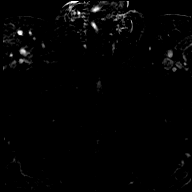

[Series 30: T1 · axial · 3.0mm · 1.15mm/px · 1 of 28 slices shown (20 of 47)]
[im 1/28]
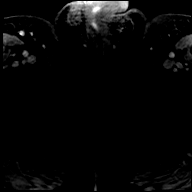

[Series 31: T1 · axial · 3.0mm · 1.15mm/px · 1 of 28 slices shown (21 of 47)]
[im 1/28]
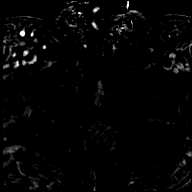

[Series 32: T1 · axial · 3.0mm · 1.15mm/px · 1 of 28 slices shown (22 of 47)]
[im 1/28]
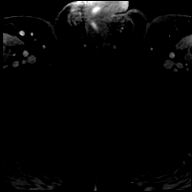

[Series 33: T1 · axial · 3.0mm · 1.15mm/px · 1 of 28 slices shown (23 of 47)]
[im 1/28]
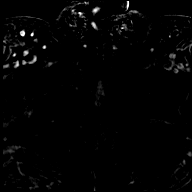

[Series 34: T1 · axial · 3.0mm · 1.15mm/px · 1 of 28 slices shown (24 of 47)]
[im 1/28]
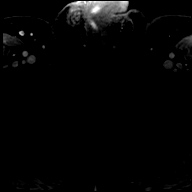

[Series 35: T1 · axial · 3.0mm · 1.15mm/px · 1 of 28 slices shown (25 of 47)]
[im 1/28]
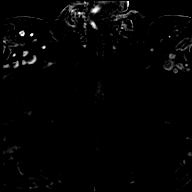

[Series 36: T1 · axial · 3.0mm · 1.15mm/px · 1 of 28 slices shown (26 of 47)]
[im 1/28]
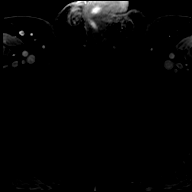

[Series 37: T1 · axial · 3.0mm · 1.15mm/px · 1 of 28 slices shown (27 of 47)]
[im 1/28]
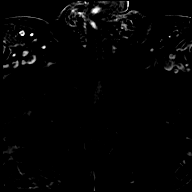

[Series 38: T1 · axial · 3.0mm · 1.15mm/px · 1 of 28 slices shown (28 of 47)]
[im 1/28]
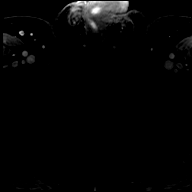

[Series 39: T1 · axial · 3.0mm · 1.15mm/px · 1 of 28 slices shown (29 of 47)]
[im 1/28]
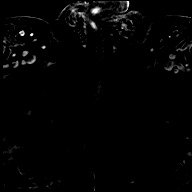

[Series 40: T1 · axial · 3.0mm · 1.15mm/px · 1 of 28 slices shown (30 of 47)]
[im 1/28]
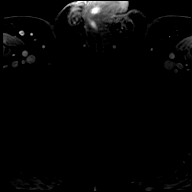

[Series 41: T1 · axial · 3.0mm · 1.15mm/px · 1 of 28 slices shown (31 of 47)]
[im 1/28]
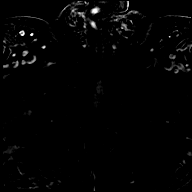

[Series 42: T1 · axial · 3.0mm · 1.15mm/px · 1 of 28 slices shown (32 of 47)]
[im 1/28]
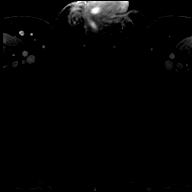

[Series 43: T1 · axial · 3.0mm · 1.15mm/px · 1 of 28 slices shown (33 of 47)]
[im 1/28]
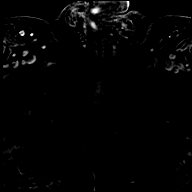

[Series 44: T1 · axial · 3.0mm · 1.15mm/px · 1 of 28 slices shown (34 of 47)]
[im 1/28]
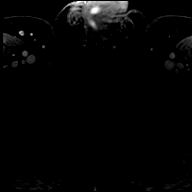

[Series 45: T1 · axial · 3.0mm · 1.15mm/px · 1 of 28 slices shown (35 of 47)]
[im 1/28]
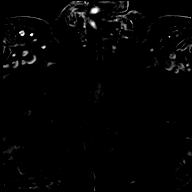

[Series 46: T1 · axial · 3.0mm · 1.15mm/px · 1 of 28 slices shown (36 of 47)]
[im 1/28]
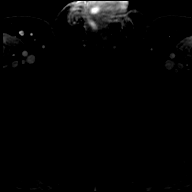

[Series 47: T1 · axial · 3.0mm · 1.15mm/px · 1 of 28 slices shown (37 of 47)]
[im 1/28]
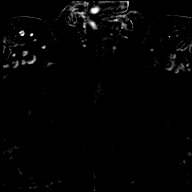

[Series 48: T1 · axial · 3.0mm · 1.15mm/px · 1 of 28 slices shown (38 of 47)]
[im 1/28]
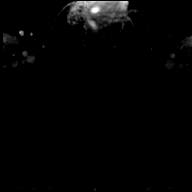

[Series 49: T1 · axial · 3.0mm · 1.15mm/px · 1 of 28 slices shown (39 of 47)]
[im 1/28]
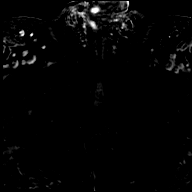

[Series 50: T1 · axial · 3.0mm · 1.15mm/px · 1 of 28 slices shown (40 of 47)]
[im 1/28]
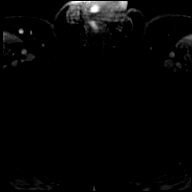

[Series 51: T1 · axial · 3.0mm · 1.15mm/px · 1 of 28 slices shown (41 of 47)]
[im 1/28]
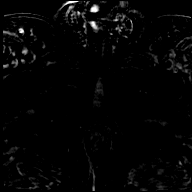

[Series 52: T1 · axial · 3.0mm · 1.15mm/px · 1 of 28 slices shown (42 of 47)]
[im 1/28]
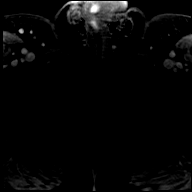

[Series 53: T1 · axial · 3.0mm · 1.15mm/px · 1 of 27 slices shown (43 of 47)]
[im 1/27]
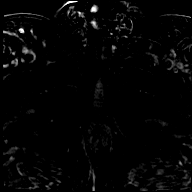

[Series 54: T1 · axial · 3.0mm · 1.15mm/px · 1 of 28 slices shown (44 of 47)]
[im 1/28]
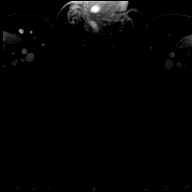

[Series 55: T1 · axial · 3.0mm · 1.15mm/px · 1 of 28 slices shown (45 of 47)]
[im 1/28]
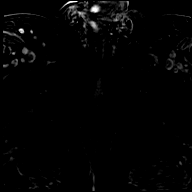

[Series 56: T1 · axial · 3.0mm · 1.15mm/px · 1 of 28 slices shown (46 of 47)]
[im 1/28]
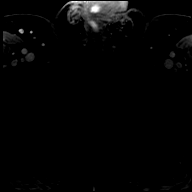

[Series 57: T1 · axial · 3.0mm · 1.15mm/px · 1 of 28 slices shown (47 of 47)]
[im 1/28]
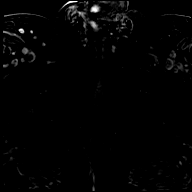

[56 of 56 positions shown; findings below may reference images not displayed]

FINDINGS: Prostate:

Region of interest # 1: PI-RADS category 4 lesion of the
posterolateral and posteromedial peripheral zones in the mid gland,
involving the right posterolateral and bilateral posteromedial
peripheral zones at the apex and left posterolateral peripheral zone
at the base. This has low T2 signal as well as early enhancement.
Mild but not prominent restricted diffusion. This lesion measures
3.66 cubic cm (3.8 by 1.0 by 2.1 cm) and is shown for example on
image 39 series 10. No bulging or directly visualized extraprostatic
spread although there is greater than 1.5 cm of capsular contact
which can raise the risk of occult transcapsular spread.

Volume: 3D volumetric analysis: Prostate volume 28.60 cubic cm (4.1
by 3.9 by 4.0 cm).

Transcapsular spread:  Absent

Seminal vesicle involvement: Absent

Neurovascular bundle involvement: Absent

Pelvic adenopathy: Absent

Bone metastasis: Absent

Other findings: Fatty lesions along the spermatic cords bilaterally.
IMPRESSION: 1. PI-RADS category 4 lesion involving the bilateral peripheral zone
as noted above. Targeting data sent to [facility].
2. Prostate volume 28.60 cubic cm.
3. Fatty prominence along the spermatic cords bilaterally. Small
groin hernias not excluded.

## 2023-11-24 ENCOUNTER — Ambulatory Visit: Payer: Self-pay | Admitting: Urology

## 2023-12-25 ENCOUNTER — Ambulatory Visit: Payer: Self-pay | Admitting: Urology

## 2023-12-25 ENCOUNTER — Encounter: Payer: Self-pay | Admitting: Urology

## 2023-12-25 VITALS — BP 126/84 | HR 61 | Ht 68.0 in | Wt 240.0 lb

## 2023-12-25 DIAGNOSIS — N401 Enlarged prostate with lower urinary tract symptoms: Secondary | ICD-10-CM

## 2023-12-25 DIAGNOSIS — Z87898 Personal history of other specified conditions: Secondary | ICD-10-CM | POA: Diagnosis not present

## 2023-12-25 DIAGNOSIS — N5201 Erectile dysfunction due to arterial insufficiency: Secondary | ICD-10-CM | POA: Diagnosis not present

## 2023-12-25 DIAGNOSIS — E291 Testicular hypofunction: Secondary | ICD-10-CM

## 2023-12-25 DIAGNOSIS — I129 Hypertensive chronic kidney disease with stage 1 through stage 4 chronic kidney disease, or unspecified chronic kidney disease: Secondary | ICD-10-CM | POA: Insufficient documentation

## 2023-12-25 MED ORDER — TAMSULOSIN HCL 0.4 MG PO CAPS: 0.4000 mg | ORAL_CAPSULE | Freq: Every day | ORAL | 0 refills | Status: DC

## 2023-12-25 NOTE — Progress Notes (Signed)
 I, Maysun Anabel Bene, acting as a scribe for Riki Altes, MD., have documented all relevant documentation on the behalf of Riki Altes, MD, as directed by Riki Altes, MD while in the presence of Riki Altes, MD.  12/25/2023 12:10 PM   Alexander Mendoza July 28, 1961 [id number]  Referring provider: Lauro Regulus, MD [address] [facility] [place],  [place] [address]  Chief Complaint  Patient presents with   Hypogonadism   Urologic history: 1.  Hypogonadism Symptoms decreased libido, tiredness, fatigue Compounded testosterone cream 20% 1 cc daily   2.  Erectile dysfunction Tadalafil 15 mg prn   3.  History of elevated PSA PSA 10.13 March 2021 MRI PI-RADS 4 lesion MR fusion biopsy 03/2019 with benign pathology   4.  BPH Moderate LUTS  HPI: Alexander Mendoza is a 63 y.o. male presents for annual follow-up.  Remains on compounded testosterone cream with good energy level and libido. He switched from tadalafil to sildenafil for his ED and states this has not been effective. He does complain of urinary urgency and decreased force in caliber urinary stream, which is bothersome.  PSA level drawn 11/13/23 was stable at 2.42 He has not had a recent testosterone level or LH.   PMH: Past Medical History:  Diagnosis Date   Allergy    Chronic kidney disease    GERD (gastroesophageal reflux disease)    Headache disorder 05/30/2020   Improved with nortryptiline   Hyperlipidemia    Hypertension     Surgical History: Past Surgical History:  Procedure Laterality Date   COLONOSCOPY     PROSTATE BIOPSY N/A 03/25/2021   Procedure: PROSTATE BIOPSY Addison Bailey;  Surgeon: Orson Ape, MD;  Location: [facility] ORS;  Service: Urology;  Laterality: N/A;   VASECTOMY     WISDOM TOOTH EXTRACTION      Home Medications:  Allergies as of 12/25/2023       Reactions   Benazepril Cough        Medication List        Accurate as of December 25, 2023 12:10 PM. If  you have any questions, ask your nurse or doctor.          STOP taking these medications    cyanocobalamin 1000 MCG tablet Stopped by: Verna Czech Eather Chaires   lactulose 10 GM/15ML solution Commonly known as: CHRONULAC Stopped by: Riki Altes       TAKE these medications    albuterol 108 (90 Base) MCG/ACT inhaler Commonly known as: VENTOLIN HFA Inhale 1 puff into the lungs every 6 (six) hours as needed for wheezing or shortness of breath.   AMBULATORY NON FORMULARY MEDICATION Testosterone 20% cream   90ml   Apply 1 ml to upper arms or thighs or behind knees daily.   Quantity: 1  Refill: 0  [facility] at  Phone:[phone] Fax: [phone]   amLODipine 5 MG tablet Commonly known as: NORVASC TAKE 1 TABLET BY MOUTH EVERY DAY   ARIPiprazole 10 MG tablet Commonly known as: ABILIFY Take 10 mg by mouth daily.   azelastine 0.1 % nasal spray Commonly known as: ASTELIN Place 1 spray into both nostrils daily as needed for rhinitis.   Dexilant 60 MG capsule Generic drug: dexlansoprazole TAKE 1 CAPSULE BY MOUTH EVERY DAY What changed: how much to take   escitalopram 20 MG tablet Commonly known as: LEXAPRO Take 20 mg by mouth daily.   Fish Oil 1200  MG Caps Take 1,200 mg by mouth daily.   fluticasone 50 MCG/ACT nasal spray Commonly known as: FLONASE Place 2 sprays into both nostrils daily as needed for allergies or rhinitis.   folic acid 800 MCG tablet Commonly known as: FOLVITE Take 800 mcg by mouth daily.   levocetirizine 5 MG tablet Commonly known as: XYZAL Take 5 mg by mouth every evening.   losartan 100 MG tablet Commonly known as: COZAAR TAKE 1 TABLET BY MOUTH EVERY DAY   MAGNESIUM CITRATE PO Take 160 mg by mouth daily.   Multi-Vitamins Tabs Take 1 tablet by mouth daily.   naltrexone 50 MG tablet Commonly known as: DEPADE Take 50 mg by mouth daily.   nortriptyline 10 MG capsule Commonly known as: PAMELOR Take 10 mg  by mouth at bedtime.   tadalafil 10 MG tablet Commonly known as: Cialis Take 1 tablet (10 mg total) by mouth daily as needed for erectile dysfunction.   tamsulosin 0.4 MG Caps capsule Commonly known as: FLOMAX Take 1 capsule (0.4 mg total) by mouth daily. Started by: Riki Altes   Testosterone 20 % Crea Apply 1 application topically daily.   Vitamin D (Cholecalciferol) 25 MCG (1000 UT) Tabs Take 1,000 Units by mouth daily.   Vitamin E 180 MG (400 UNIT) Caps Take 400 Units by mouth daily.        Allergies:  Allergies  Allergen Reactions   Benazepril Cough    Family History: Family History  Problem Relation Age of Onset   Cancer Mother        Breast   Hypertension Father    Cancer Brother        Testicular    Social History:  reports that he has never smoked. His smokeless tobacco use includes snuff. He reports current alcohol use. He reports that he does not use drugs.   Physical Exam: BP 126/84   Pulse 61   Ht 5\' 8"  (1.727 m)   Wt 240 lb (108.9 kg)   BMI 36.49 kg/m   Constitutional:  Alert and oriented, No acute distress. HEENT: [place] AT Respiratory: Normal respiratory effort, no increased work of breathing. Psychiatric: Normal mood and affect.   Assessment & Plan:    1. Hypogonadism Testosterone, H/H drawn today.   2. Lower urinary tract symptoms He was interested in a trial of tamsulosin and Rx sent to pharmacy.  He will call back regarding efficacy  3. Erectile dysfunction PDE5 inhibitor refractory.  We discussed intracavernosal injections and he was provided website literature for review. If he is interested in pursuing, he will call back for an Rx and an appointment for a PA training.   4. History of elevated PSA Recent PSA stable.  I have reviewed the above documentation for accuracy and completeness, and I agree with the above.   Riki Altes, MD  [facility] [address] [place], [place]  [address] [phone]

## 2023-12-25 NOTE — Patient Instructions (Signed)
 [email or web address]

## 2023-12-26 LAB — TESTOSTERONE: Testosterone: 351 ng/dL (ref 264–916)

## 2023-12-26 LAB — HEMOGLOBIN AND HEMATOCRIT, BLOOD
Hematocrit: 54.3 % — ABNORMAL HIGH (ref 37.5–51.0)
Hemoglobin: 18 g/dL — ABNORMAL HIGH (ref 13.0–17.7)

## 2023-12-27 ENCOUNTER — Encounter: Payer: Self-pay | Admitting: *Deleted

## 2023-12-27 ENCOUNTER — Other Ambulatory Visit: Payer: Self-pay | Admitting: *Deleted

## 2023-12-27 DIAGNOSIS — D751 Secondary polycythemia: Secondary | ICD-10-CM

## 2024-01-02 ENCOUNTER — Inpatient Hospital Stay

## 2024-01-02 ENCOUNTER — Encounter: Payer: Self-pay | Admitting: Oncology

## 2024-01-02 ENCOUNTER — Inpatient Hospital Stay: Attending: Oncology | Admitting: Oncology

## 2024-01-02 VITALS — BP 116/64 | HR 75 | Temp 98.3°F | Resp 18 | Ht 68.0 in | Wt 244.3 lb

## 2024-01-02 DIAGNOSIS — Z8043 Family history of malignant neoplasm of testis: Secondary | ICD-10-CM | POA: Insufficient documentation

## 2024-01-02 DIAGNOSIS — Z803 Family history of malignant neoplasm of breast: Secondary | ICD-10-CM | POA: Insufficient documentation

## 2024-01-02 DIAGNOSIS — D751 Secondary polycythemia: Secondary | ICD-10-CM | POA: Insufficient documentation

## 2024-01-02 DIAGNOSIS — E291 Testicular hypofunction: Secondary | ICD-10-CM | POA: Insufficient documentation

## 2024-01-02 DIAGNOSIS — E271 Primary adrenocortical insufficiency: Secondary | ICD-10-CM

## 2024-01-02 DIAGNOSIS — F17290 Nicotine dependence, other tobacco product, uncomplicated: Secondary | ICD-10-CM | POA: Insufficient documentation

## 2024-01-02 LAB — CBC (CANCER CENTER ONLY)
HCT: 53.4 % — ABNORMAL HIGH (ref 39.0–52.0)
Hemoglobin: 17.3 g/dL — ABNORMAL HIGH (ref 13.0–17.0)
MCH: 32.1 pg (ref 26.0–34.0)
MCHC: 32.4 g/dL (ref 30.0–36.0)
MCV: 99.1 fL (ref 80.0–100.0)
Platelet Count: 246 10*3/uL (ref 150–400)
RBC: 5.39 MIL/uL (ref 4.22–5.81)
RDW: 14.3 % (ref 11.5–15.5)
WBC Count: 6.8 10*3/uL (ref 4.0–10.5)
nRBC: 0 % (ref 0.0–0.2)

## 2024-01-02 LAB — FERRITIN: Ferritin: 29 ng/mL (ref 24–336)

## 2024-01-02 LAB — IRON AND TIBC
Iron: 57 ug/dL (ref 45–182)
Saturation Ratios: 12 % — ABNORMAL LOW (ref 17.9–39.5)
TIBC: 477 ug/dL — ABNORMAL HIGH (ref 250–450)
UIBC: 420 ug/dL

## 2024-01-02 NOTE — Progress Notes (Signed)
 [facility]  Telephone:([phone] [phone]) [phone]  ID: Alexander Mendoza OB: 14-May-1961  MR#: [id number]  AOZ#:[id number]  Patient Care Team: Lauro Regulus, MD as PCP - General (Internal Medicine) Jeralyn Ruths, MD as Consulting Physician (Oncology)  CHIEF COMPLAINT: Polycythemia.  INTERVAL HISTORY: Patient is a 63 year old male who takes testosterone supplement cream is noted to have an increasing hemoglobin.  He is referred for further evaluation and consideration of phlebotomy.  He currently feels well and is asymptomatic.  He does not complain of any weakness or fatigue.  He has no neurologic complaints.  He denies any recent fevers or illnesses.  He has a good appetite and denies weight loss.  He denies any chest pain, shortness of breath, cough, or hemoptysis.  He denies any nausea, vomiting, constipation, or diarrhea.  He has no urinary complaints.  Patient offers no specific complaints today.  REVIEW OF SYSTEMS:   Review of Systems  Constitutional: Negative.  Negative for fever, malaise/fatigue and weight loss.  Respiratory: Negative.  Negative for cough and shortness of breath.   Cardiovascular: Negative.  Negative for chest pain and leg swelling.  Gastrointestinal: Negative.  Negative for abdominal pain.  Genitourinary: Negative.  Negative for dysuria.  Musculoskeletal: Negative.  Negative for back pain.  Skin: Negative.  Negative for rash.  Neurological: Negative.  Negative for dizziness, focal weakness, weakness and headaches.  Psychiatric/Behavioral: Negative.  The patient is not nervous/anxious.     As per HPI. Otherwise, a complete review of systems is negative.  PAST MEDICAL HISTORY: Past Medical History:  Diagnosis Date   Allergy    Anxiety 04/04/2021   Chronic kidney disease    GERD (gastroesophageal reflux disease)    Headache disorder 05/30/2020   Improved with nortryptiline   Hyperlipidemia    Hypertension     PAST  SURGICAL HISTORY: Past Surgical History:  Procedure Laterality Date   COLONOSCOPY     PROSTATE BIOPSY N/A 03/25/2021   Procedure: PROSTATE BIOPSY Addison Bailey;  Surgeon: Orson Ape, MD;  Location: [facility] ORS;  Service: Urology;  Laterality: N/A;   VASECTOMY     WISDOM TOOTH EXTRACTION      FAMILY HISTORY: Family History  Problem Relation Age of Onset   Cancer Mother        Breast   Hypertension Father    Cancer Brother        Testicular    ADVANCED DIRECTIVES (Y/N):  N  HEALTH MAINTENANCE: Social History   Tobacco Use   Smoking status: Never   Smokeless tobacco: Current    Types: Snuff  Vaping Use   Vaping status: Never Used  Substance Use Topics   Alcohol use: Yes    Alcohol/week: 6.0 standard drinks of alcohol    Types: 6 Cans of beer per week    Comment: Last drink Friday afternoon, 3 mix drinks/day   Drug use: No     Colonoscopy:  PAP:  Bone density:  Lipid panel:  Allergies  Allergen Reactions   Benazepril Cough    Current Outpatient Medications  Medication Sig Dispense Refill   albuterol (PROVENTIL HFA;VENTOLIN HFA) 108 (90 Base) MCG/ACT inhaler Inhale 1 puff into the lungs every 6 (six) hours as needed for wheezing or shortness of breath. 1 Inhaler 3   AMBULATORY NON FORMULARY MEDICATION Testosterone 20% cream   90ml   Apply 1 ml to upper arms or thighs or behind knees daily.   Quantity: 1  Refill: 0  [facility] at  Phone:[phone] Fax: [phone] 90 mL 0   amLODipine (NORVASC) 5 MG tablet TAKE 1 TABLET BY MOUTH EVERY DAY (Patient taking differently: Take 5 mg by mouth daily.) 30 tablet 0   ARIPiprazole (ABILIFY) 10 MG tablet Take 10 mg by mouth daily.     azelastine (ASTELIN) 0.1 % nasal spray Place 1 spray into both nostrils daily as needed for rhinitis. 30 mL 12   DEXILANT 60 MG capsule TAKE 1 CAPSULE BY MOUTH EVERY DAY (Patient taking differently: Take 60 mg by mouth daily.) 90 capsule 1   escitalopram (LEXAPRO) 20  MG tablet Take 20 mg by mouth daily.     fluticasone (FLONASE) 50 MCG/ACT nasal spray Place 2 sprays into both nostrils daily as needed for allergies or rhinitis. 16 g 12   folic acid (FOLVITE) 800 MCG tablet Take 800 mcg by mouth daily.     levocetirizine (XYZAL) 5 MG tablet Take 5 mg by mouth every evening.     losartan (COZAAR) 100 MG tablet TAKE 1 TABLET BY MOUTH EVERY DAY (Patient taking differently: Take 100 mg by mouth daily.) 90 tablet 1   MAGNESIUM CITRATE PO Take 160 mg by mouth daily.     Multiple Vitamin (MULTI-VITAMINS) TABS Take 1 tablet by mouth daily.     naltrexone (DEPADE) 50 MG tablet Take 50 mg by mouth daily.     nortriptyline (PAMELOR) 10 MG capsule Take 10 mg by mouth at bedtime.     Omega-3 Fatty Acids (FISH OIL) 1200 MG CAPS Take 1,200 mg by mouth daily.     tadalafil (CIALIS) 10 MG tablet Take 1 tablet (10 mg total) by mouth daily as needed for erectile dysfunction. 30 tablet 5   tamsulosin (FLOMAX) 0.4 MG CAPS capsule Take 1 capsule (0.4 mg total) by mouth daily. 30 capsule 0   Testosterone 20 % CREA Apply 1 application topically daily.     Vitamin D, Cholecalciferol, 25 MCG (1000 UT) TABS Take 1,000 Units by mouth daily.     Vitamin E 180 MG (400 UNIT) CAPS Take 400 Units by mouth daily.     No current facility-administered medications for this visit.    OBJECTIVE: Vitals:   01/02/24 1110  BP: 116/64  Pulse: 75  Resp: 18  Temp: 98.3 F (36.8 C)  SpO2: 98%     Body mass index is 37.15 kg/m.    ECOG FS:0 - Asymptomatic  General: Well-developed, well-nourished, no acute distress. Eyes: Pink conjunctiva, anicteric sclera. HEENT: Normocephalic, moist mucous membranes. Lungs: No audible wheezing or coughing. Heart: Regular rate and rhythm. Abdomen: Soft, nontender, no obvious distention. Musculoskeletal: No edema, cyanosis, or clubbing. Neuro: Alert, answering all questions appropriately. Cranial nerves grossly intact. Skin: No rashes or petechiae  noted. Psych: Normal affect. Lymphatics: No cervical, calvicular, axillary or inguinal LAD.   LAB RESULTS:  Lab Results  Component Value Date   NA 145 06/14/2020   K 3.7 06/14/2020   CL 108 06/14/2020   CO2 26 06/14/2020   GLUCOSE 115 (H) 06/14/2020   BUN 7 (L) 06/14/2020   CREATININE 1.10 06/14/2020   CALCIUM 8.0 (L) 06/14/2020   PROT 6.5 06/14/2020   ALBUMIN 3.1 (L) 06/14/2020   AST 43 (H) 06/14/2020   ALT 35 06/14/2020   ALKPHOS 80 06/14/2020   BILITOT 0.9 06/14/2020   GFRNONAA >60 06/14/2020   GFRAA >60 06/14/2020    Lab Results  Component Value Date   WBC 6.8 01/02/2024   NEUTROABS 2.5 06/14/2020  HGB 17.3 (H) 01/02/2024   HCT 53.4 (H) 01/02/2024   MCV 99.1 01/02/2024   PLT 246 01/02/2024     STUDIES: No results found.  ASSESSMENT: Polycythemia.  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  Patient's hemoglobin remains elevated at 17.3.  All of his other laboratory work including iron stores, carbon monoxide level, erythropoietin level, and JAK2 mutation with reflex are pending at time of dictation.  Patient will benefit from phlebotomy with a goal hemoglobin of 17.0.  Return to clinic in 2 weeks for further evaluation, discussion of his laboratory results, and initiation of treatment. Low testosterone: Continue testosterone cream as prescribed.  I spent a total of 45 minutes reviewing chart data, face-to-face evaluation with the patient, counseling and coordination of care as detailed above.   Patient expressed understanding and was in agreement with this plan. He also understands that He can call clinic at any time with any questions, concerns, or complaints.     Jeralyn Ruths, MD   01/02/2024 1:26 PM

## 2024-01-02 NOTE — Progress Notes (Signed)
 Patient presents today with erythrocytosis. He was using testosterone for the last 4-5 years. States that he has not used it since last Thursday when he saw his blood counts were elevated.

## 2024-01-03 LAB — CARBON MONOXIDE, BLOOD (PERFORMED AT REF LAB): Carbon Monoxide, Blood: 2.7 % (ref 0.0–3.6)

## 2024-01-03 LAB — ERYTHROPOIETIN: Erythropoietin: 91.2 m[IU]/mL — ABNORMAL HIGH (ref 2.6–18.5)

## 2024-01-04 ENCOUNTER — Telehealth (HOSPITAL_COMMUNITY): Payer: Self-pay | Admitting: Licensed Clinical Social Worker

## 2024-01-04 NOTE — Telephone Encounter (Signed)
 The therapist receives a voicemail from this patient asking if the substance abuse program at [facility] takes [facility].  The therapist attempts to reach him leaving a HIPAA-compliant voicemail.  Myrna Blazer, MA, LCSW, [facility], LCAS 01/04/2024

## 2024-01-05 ENCOUNTER — Telehealth (HOSPITAL_COMMUNITY): Payer: Self-pay | Admitting: Licensed Clinical Social Worker

## 2024-01-05 NOTE — Telephone Encounter (Signed)
 The therapist returns Alexander Mendoza's call and he says that he would like to get help with alcohol so schedules to see this therapist for an assessment on 01/11/24 at 3 p.m. to come for new patient paperwork at 2:30 p.m. He is given both the Receptionist and this therapist's direct contact number.  Myrna Blazer, MA, LCSW, [facility], LCAS 01/05/2024

## 2024-01-10 LAB — JAK2 V617F RFX CALR/MPL/E12-15

## 2024-01-10 LAB — CALR +MPL + E12-E15  (REFLEX)

## 2024-01-11 ENCOUNTER — Ambulatory Visit (INDEPENDENT_AMBULATORY_CARE_PROVIDER_SITE_OTHER): Payer: Self-pay | Admitting: Licensed Clinical Social Worker

## 2024-01-11 DIAGNOSIS — F1020 Alcohol dependence, uncomplicated: Secondary | ICD-10-CM | POA: Diagnosis not present

## 2024-01-11 DIAGNOSIS — Z720 Tobacco use: Secondary | ICD-10-CM

## 2024-01-11 NOTE — Progress Notes (Signed)
 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 He started drinking and blacking out, went to [facility] and stayed sober for 2 years, and resumed drinking a year ago which causes marital problems. He wants to want to quit drinking. He is drinking three 16 ounce cocktail in a can which is 10%.    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 He eventually was drinking two pints per  day, went to [facility], was sober for a couple of years but resumed drinking a year ago. His wife wants him to stop; however, part of him tells him that he can manage it. He attends AA several times per week and has not told his Sponsor he has been drinking but is encouraged to do so today.    CCA Screening, Triage and Referral (STR)  Patient Reported Information How did you hear about [place]? Other (Comment) (online)  Referral name: No data recorded Referral phone number: No data recorded  Whom do you see for routine medical problems? Primary Care  Practice/Facility Name: No data recorded Practice/Facility Phone Number: No data recorded Name of Contact: Dr. Jerrye Noble Number: No data recorded Contact Fax Number: No data recorded Prescriber Name: [facility] in [place]  Prescriber Address (if known): No data recorded  What Is the Reason for Your Visit/Call Today? alcohol  How Long Has This Been Causing You Problems? > than 6 months  What Do You Feel Would Help You the Most Today? Alcohol or Drug Use Treatment   Have You Recently Been in Any Inpatient Treatment (Hospital/Detox/Crisis Center/28-Day  Program)? No  Name/Location of Program/Hospital:No data recorded How Long Were You There? No data recorded When Were You Discharged? No data recorded  Have You Ever Received Services From [facility] Before? Yes  Who Do You See at [facility]? No data recorded  Have You Recently Had Any Thoughts About Hurting Yourself? No  Are You Planning to Commit Suicide/Harm Yourself At This time? No   Have you Recently Had Thoughts About Hurting Someone [name]? No  Explanation: No data recorded  Have You Used Any Alcohol or Drugs in the Past 24 Hours? Yes  How Long Ago Did You Use Drugs or Alcohol? No data recorded What Did You Use and How Much? No data recorded  Do You Currently Have a Therapist/Psychiatrist? No  Name of Therapist/Psychiatrist: No data recorded  Have You Been Recently Discharged From Any Office Practice or Programs? No  Explanation of Discharge From Practice/Program: No data recorded    CCA Screening Triage Referral Assessment Type of Contact: Face-to-Face  Is this Initial or Reassessment? No data recorded Date Telepsych consult ordered in [facility]:  No data recorded Time Telepsych consult ordered in [facility]:  No data recorded  Patient Reported Information Reviewed? No data recorded Patient Left Without Being Seen? No data recorded Reason for Not Completing Assessment: No data recorded  Collateral Involvement: N/A   Does Patient Have a Court Appointed Legal Guardian? No data recorded Name and Contact of Legal Guardian: No data recorded If Minor and Not Living with Parent(s), Who has Custody? No  data recorded Is CPS involved or ever been involved? Never  Is APS involved or ever been involved? Never   Patient Determined To Be At Risk for Harm To Self or Others Based on Review of Patient Reported Information or Presenting Complaint? No  Method: No Plan  Availability of Means: No data recorded Intent: No data recorded Notification Required: No need or identified  person  Additional Information for Danger to Others Potential: No data recorded Additional Comments for Danger to Others Potential: No data recorded Are There Guns or Other Weapons in Your Home? Yes  Types of Guns/Weapons: No data recorded Are These Weapons Safely Secured?                            Yes  Who Could Verify You Are Able To Have These Secured: No data recorded Do You Have any Outstanding Charges, Pending Court Dates, Parole/Probation? No data recorded Contacted To Inform of Risk of Harm To Self or Others: No data recorded  Location of Assessment: Other (comment) ([address])   Does Patient Present under Involuntary Commitment? No  IVC Papers Initial File Date: No data recorded  [place] of Residence: [place]   Patient Currently Receiving the Following Services: Medication Management (PCP has prescribed him Naltrexone X 3 weeks)   Determination of Need: Urgent (48 hours)   Options For Referral: Chemical Dependency Intensive Outpatient Therapy (CDIOP); Medication Management; Other: Comment (inpatient detox)     CCA Biopsychosocial Intake/Chief Complaint:  wife wants him to quit drinking but he wants to want to quit but remains ambivalent  Current Symptoms/Problems: drinking the equivalent of about a six pack per day   Patient Reported Schizophrenia/Schizoaffective Diagnosis in Past: No   Strengths: intelligent  Preferences: No data recorded Abilities: No data recorded  Type of Services Patient Feels are Needed: outpatient therapy   Initial Clinical Notes/Concerns: No data recorded  Mental Health Symptoms Depression:  None   Duration of Depressive symptoms: No data recorded  Mania:  None   Anxiety:   None   Psychosis:  None   Duration of Psychotic symptoms: No data recorded  Trauma:  None   Obsessions:  None   Compulsions:  None   Inattention:  None   Hyperactivity/Impulsivity:  None   Oppositional/Defiant Behaviors:  None    Emotional Irregularity:  None   Other Mood/Personality Symptoms:  No data recorded   Mental Status Exam Appearance and self-care  Stature:  Average (5"8)   Weight:  Overweight (244 pounds)   Clothing:  Casual   Grooming:  Normal   Cosmetic use:  None   Posture/gait:  Normal   Motor activity:  Not Remarkable   Sensorium  Attention:  Normal   Concentration:  Normal   Orientation:  X5   Recall/memory:  Normal   Affect and Mood  Affect:  Full Range   Mood:  Euthymic   Relating  Eye contact:  Normal   Facial expression:  Responsive   Attitude toward examiner:  Cooperative   Thought and Language  Speech flow: Clear and Coherent   Thought content:  Appropriate to Mood and Circumstances   Preoccupation:  None   Hallucinations:  None   Organization:  No data recorded  [employer] of Knowledge:  Good   Intelligence:  Above Average   Abstraction:  Abstract   Judgement:  Good   Reality Testing:  Adequate   Insight:  Flashes of insight;  Denial   Decision Making:  Normal   Social Functioning  Social Maturity:  Responsible   Social Judgement:  Normal   Stress  Stressors:  Family conflict   Coping Ability:  Human resources officer Deficits:  Self-control   Supports:  Family (wife and three or four friends who drink as much as Designer, multimedia does or more)     Religion: Religion/Spirituality Are You A Religious Person?: Yes What is Your Religious Affiliation?: None How Might This Affect Treatment?: believes if he would pray and ask to not drink that he woud not drink but has not done so in the past 6-8 weeks  Leisure/Recreation: Leisure / Recreation Do You Have Hobbies?: Yes Leisure and Hobbies: 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  Exercise/Diet: Exercise/Diet Do You Exercise?: No Have You Gained or Lost A Significant Amount of Weight in the Past Six Months?: Yes-Lost Number of Pounds Lost?: 10 (lost ten pound without  trying) Do You Follow a Special Diet?: No Do You Have Any Trouble Sleeping?: No   CCA Employment/Education Employment/Work Situation: Employment / Work 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?: No  Education: Education Is Patient Currently Attending School?: No Last Grade Completed: 18 Name of High School: [employer] Did [place] Graduate From [employer]?: Yes Did Theme park manager?: Yes What Type of College Degree Do you Have?: BS in Secondary Mathematics Did You Attend Graduate School?: Yes (MA in Early Adolescent Education) Did You Have An Individualized Education Program (IIEP): No Did You Have Any Difficulty At School?: No Patient's Education Has Been Impacted by Current Illness: No   CCA Family/Childhood History Family and Relationship History: Family history Marital status: Married Number of Years Married: 7 What types of issues is patient dealing with in the relationship?: marital problems due to drinking as wife is afraid he will get back to where he was before Are you sexually active?: No (ED pretty bad) What is your sexual orientation?: heterosexual Has your sexual activity been affected by drugs, alcohol, medication, or emotional stress?: probably medications Does patient have children?: Yes How many children?: 2 How is patient's relationship with their children?: has two children ages 34 and 60; his relationship with them is very good; his son wil tell Kenyatta "every now and then" to "behave" concerning his drinking  Childhood History:  Childhood History By whom was/is the patient raised?: Both parents Additional childhood history information: grew up in [place] [facility] raised by both  parents with a "good childhood" saying that his mom was not lovey dovey but more concerned about what the community thought; his father was not lovey dovey either but was "all accepting;" both brothers smoke weed and his sister drinks Description of patient's relationship with caregiver when they were a child: good Patient's description of current relationship with people who raised him/her: mom passed away years ago from cancer and has a good relationship with his father How were you disciplined when you got in trouble as a child/adolescent?: spankings Does patient have siblings?: Yes Number of Siblings: 3 Description of patient's current relationship with siblings: two brothers and one sister Did patient suffer any verbal/emotional/physical/sexual abuse as a child?: No Did patient suffer from severe childhood neglect?: No  Has patient ever been sexually abused/assaulted/raped as an adolescent or adult?: No Was the patient ever a victim of a crime or a disaster?: No Witnessed domestic violence?: No Has patient been affected by domestic violence as an adult?: No  Child/Adolescent Assessment:     CCA Substance Use Alcohol/Drug Use: Alcohol / Drug Use Pain Medications: No pain medications Prescriptions: Lexapro, Abilify (years;) Flomax; Pamelor for headaches; Omeprazole; and two blood pressure medications Over the Counter: Magnesium citrate for Restless Legs History of alcohol / drug use?: Yes Longest period of sobriety (when/how long): 2 years Negative Consequences of Use: Personal relationships Withdrawal Symptoms: Tremors, Blackouts Substance #1 Name of Substance 1: Alcohol 1 - Age of First Use: 18 1 - Amount (size/oz): currently drinking the equivalent of a six pack per day 1 - Frequency: daily 1 - Duration: year 1 - Last Use / Amount: 10 a.m. drank one can 1 - Method of Aquiring: legal 1- Route of Use: oral Substance #2 Name of Substance 2: smokeless tobacco 2 - Age of First  Use: 63 years old 2 - Amount (size/oz): dip all day long 2 - Frequency: daily 2 - Duration: since age 1 2 - Last Use / Amount: today 2 - Method of Aquiring: legal 2 - Route of Substance Use: oral                     ASAM's:  Six Dimensions of Multidimensional Assessment  Dimension 1:  Acute Intoxication and/or Withdrawal Potential:   Dimension 1:  Description of individual's past and current experiences of substance use and withdrawal: experience mild tremors when he stopped drinking in the past  Dimension 2:  Biomedical Conditions and Complications:   Dimension 2:  Description of patient's biomedical conditions and  complications: HTN, GERD, and headaches  Dimension 3:  Emotional, Behavioral, or Cognitive Conditions and Complications:  Dimension 3:  Description of emotional, behavioral, or cognitive conditions and complications: anxiety is controlled currently on medications; he does suggest he has some social anxiety in crowds  Dimension 4:  Readiness to Change:  Dimension 4:  Description of Readiness to Change criteria: able to verbalize the negatives of drinking but also not ready to stop  Dimension 5:  Relapse, Continued use, or Continued Problem Potential:  Dimension 5:  Relapse, continued use, or continued problem potential critiera description: unable to stop drinking currently thinking he can manage it  Dimension 6:  Recovery/Living Environment:  Dimension 6:  Recovery/Iiving environment criteria description: wife wants him to stop drinking  ASAM Severity Score: ASAM's Severity Rating Score: 6  ASAM Recommended Level of Treatment: ASAM Recommended Level of Treatment: Level II Intensive Outpatient Treatment   Substance use Disorder (SUD) Substance Use Disorder (SUD)  Checklist Symptoms of Substance Use: Continued use despite having a persistent/recurrent physical/psychological problem caused/exacerbated by use, Continued use despite persistent or recurrent social,  interpersonal problems, caused or exacerbated by use, Evidence of tolerance, Evidence of withdrawal (Comment), Persistent desire or unsuccessful efforts to cut down or control use, Large amounts of time spent to obtain, use or recover from the substance(s), Presence of craving or strong urge to use, Repeated use in physically hazardous situations, Social, occupational, recreational activities given up or reduced due to use, Substance(s) often taken in larger amounts or over longer times than was intended  Recommendations for Services/Supports/Treatments: Recommendations for Services/Supports/Treatments Recommendations For Services/Supports/Treatments: Detox, CD-IOP Intensive Chemical Dependency Program  DSM5 Diagnoses: Patient Active Problem List   Diagnosis Date Noted  Polycythemia, secondary 01/02/2024   Benign hypertension with CKD (chronic kidney disease) stage III (HCC) 12/25/2023   Prediabetes 11/10/2021   Elevated PSA 05/10/2021   Alcoholism (HCC) 10/22/2020   Anxiety, generalized 09/23/2020   Alcohol abuse with intoxication (HCC) 06/14/2020   Hepatic encephalopathy (HCC) 06/13/2020   Vitamin B12 deficiency 06/05/2020   Vitamin D deficiency 06/05/2020   Headache disorder 05/30/2020   Seizure-like activity (HCC) 05/07/2020   Cognitive and behavioral changes 04/02/2020   ED (erectile dysfunction) 04/01/2020   Allergic rhinitis 02/05/2020   Palpitations 06/13/2018   Hypertension 04/09/2015   GERD (gastroesophageal reflux disease) 04/09/2015   Gout 04/09/2015    Patient Centered Plan: Patient is on the following Treatment Plan(s):  Deferred until next visit   Referrals to Alternative Service(s): Referred to Alternative Service(s):   Place:   Date:   Time:    Referred to Alternative Service(s):   Place:   Date:   Time:    Referred to Alternative Service(s):   Place:   Date:   Time:    Referred to Alternative Service(s):   Place:   Date:   Time:      Collaboration of Care:  Other N/A  Patient/Guardian was advised Release of Information must be obtained prior to any record release in order to collaborate their care with an outside provider. Patient/Guardian was advised if they have not already done so to contact the registration department to sign all necessary forms in order for [place] to release information regarding their care.   Consent: Patient/Guardian gives verbal consent for treatment and assignment of benefits for services provided during this visit. Patient/Guardian expressed understanding and agreed to proceed.   Plan: Alexander Mendoza wishes to return in a week.   Myrna Blazer, MA, LCSW, [facility], LCAS 01/11/2024

## 2024-01-16 ENCOUNTER — Inpatient Hospital Stay

## 2024-01-16 ENCOUNTER — Inpatient Hospital Stay: Attending: Oncology | Admitting: Oncology

## 2024-01-16 ENCOUNTER — Encounter: Payer: Self-pay | Admitting: Oncology

## 2024-01-16 ENCOUNTER — Other Ambulatory Visit: Payer: Self-pay | Admitting: Urology

## 2024-01-16 VITALS — BP 102/66 | HR 65 | Temp 98.2°F | Resp 18

## 2024-01-16 VITALS — BP 108/70 | HR 59 | Temp 98.0°F | Resp 18 | Ht 68.0 in | Wt 247.2 lb

## 2024-01-16 DIAGNOSIS — Z8043 Family history of malignant neoplasm of testis: Secondary | ICD-10-CM | POA: Diagnosis not present

## 2024-01-16 DIAGNOSIS — Z803 Family history of malignant neoplasm of breast: Secondary | ICD-10-CM | POA: Diagnosis not present

## 2024-01-16 DIAGNOSIS — D751 Secondary polycythemia: Secondary | ICD-10-CM | POA: Diagnosis not present

## 2024-01-16 DIAGNOSIS — E291 Testicular hypofunction: Secondary | ICD-10-CM | POA: Insufficient documentation

## 2024-01-16 DIAGNOSIS — F17290 Nicotine dependence, other tobacco product, uncomplicated: Secondary | ICD-10-CM | POA: Diagnosis not present

## 2024-01-16 DIAGNOSIS — Z79890 Hormone replacement therapy (postmenopausal): Secondary | ICD-10-CM | POA: Insufficient documentation

## 2024-01-16 DIAGNOSIS — Z87898 Personal history of other specified conditions: Secondary | ICD-10-CM

## 2024-01-16 DIAGNOSIS — N401 Enlarged prostate with lower urinary tract symptoms: Secondary | ICD-10-CM

## 2024-01-16 DIAGNOSIS — D582 Other hemoglobinopathies: Secondary | ICD-10-CM

## 2024-01-16 DIAGNOSIS — N5201 Erectile dysfunction due to arterial insufficiency: Secondary | ICD-10-CM

## 2024-01-16 NOTE — Progress Notes (Signed)
 Patient tolerated phlebotomy well used 20 gauge IV. Removed 500 mls as ordered. No concerns and stable at discharge. AVS reviewed and pt received a copy.

## 2024-01-16 NOTE — Progress Notes (Unsigned)
 [facility]  [phone] [phone]  ID: Janalyn Shy OB: [birth year]  MR#: [id number]  QIO#:[id number]  Patient Care Team: Lauro Regulus, MD as PCP - General (Internal Medicine) Jeralyn Ruths, MD as Consulting Physician (Oncology)  CHIEF COMPLAINT: Polycythemia.  INTERVAL HISTORY: Patient returns to clinic today for further evaluation and consideration of phlebotomy.  He continues to feel well and remains asymptomatic.  He does not complain of any weakness or fatigue.  He has no neurologic complaints.  He denies any recent fevers or illnesses.  He has a good appetite and denies weight loss.  He denies any chest pain, shortness of breath, cough, or hemoptysis.  He denies any nausea, vomiting, constipation, or diarrhea.  He has no urinary complaints.  Patient offers no specific complaints today.  REVIEW OF SYSTEMS:   Review of Systems  Constitutional: Negative.  Negative for fever, malaise/fatigue and weight loss.  Respiratory: Negative.  Negative for cough and shortness of breath.   Cardiovascular: Negative.  Negative for chest pain and leg swelling.  Gastrointestinal: Negative.  Negative for abdominal pain.  Genitourinary: Negative.  Negative for dysuria.  Musculoskeletal: Negative.  Negative for back pain.  Skin: Negative.  Negative for rash.  Neurological: Negative.  Negative for dizziness, focal weakness, weakness and headaches.  Psychiatric/Behavioral: Negative.  The patient is not nervous/anxious.     As per HPI. Otherwise, a complete review of systems is negative.  PAST MEDICAL HISTORY: Past Medical History:  Diagnosis Date   Allergy    Anxiety 04/04/2021   Chronic kidney disease    GERD (gastroesophageal reflux disease)    Headache disorder 05/30/2020   Improved with nortryptiline   Hyperlipidemia    Hypertension     PAST SURGICAL HISTORY: Past Surgical History:  Procedure Laterality Date   COLONOSCOPY      PROSTATE BIOPSY N/A 03/25/2021   Procedure: PROSTATE BIOPSY Addison Bailey;  Surgeon: Orson Ape, MD;  Location: [facility] ORS;  Service: Urology;  Laterality: N/A;   VASECTOMY     WISDOM TOOTH EXTRACTION      FAMILY HISTORY: Family History  Problem Relation Age of Onset   Cancer Mother        Breast   Hypertension Father    Cancer Brother        Testicular    ADVANCED DIRECTIVES (Y/N):  N  HEALTH MAINTENANCE: Social History   Tobacco Use   Smoking status: Never   Smokeless tobacco: Current    Types: Snuff  Vaping Use   Vaping status: Never Used  Substance Use Topics   Alcohol use: Yes    Alcohol/week: 6.0 standard drinks of alcohol    Types: 6 Cans of beer per week    Comment: Last drink Friday afternoon, 3 mix drinks/day   Drug use: No     Colonoscopy:  PAP:  Bone density:  Lipid panel:  Allergies  Allergen Reactions   Benazepril Cough    Current Outpatient Medications  Medication Sig Dispense Refill   albuterol (PROVENTIL HFA;VENTOLIN HFA) 108 (90 Base) MCG/ACT inhaler Inhale 1 puff into the lungs every 6 (six) hours as needed for wheezing or shortness of breath. 1 Inhaler 3   AMBULATORY NON FORMULARY MEDICATION Testosterone 20% cream   90ml   Apply 1 ml to upper arms or thighs or behind knees daily.   Quantity: 1  Refill: 0  [facility] at  [phone] Fax: [phone] 90 mL 0   amLODipine (NORVASC) 5 MG  tablet TAKE 1 TABLET BY MOUTH EVERY DAY (Patient taking differently: Take 5 mg by mouth daily.) 30 tablet 0   ARIPiprazole (ABILIFY) 10 MG tablet Take 10 mg by mouth daily.     azelastine (ASTELIN) 0.1 % nasal spray Place 1 spray into both nostrils daily as needed for rhinitis. 30 mL 12   DEXILANT 60 MG capsule TAKE 1 CAPSULE BY MOUTH EVERY DAY (Patient taking differently: Take 60 mg by mouth daily.) 90 capsule 1   escitalopram (LEXAPRO) 20 MG tablet Take 20 mg by mouth daily.     fluticasone (FLONASE) 50 MCG/ACT nasal spray  Place 2 sprays into both nostrils daily as needed for allergies or rhinitis. 16 g 12   folic acid (FOLVITE) 800 MCG tablet Take 800 mcg by mouth daily.     levocetirizine (XYZAL) 5 MG tablet Take 5 mg by mouth every evening.     losartan (COZAAR) 100 MG tablet TAKE 1 TABLET BY MOUTH EVERY DAY (Patient taking differently: Take 100 mg by mouth daily.) 90 tablet 1   MAGNESIUM CITRATE PO Take 160 mg by mouth daily.     Multiple Vitamin (MULTI-VITAMINS) TABS Take 1 tablet by mouth daily.     naltrexone (DEPADE) 50 MG tablet Take 50 mg by mouth daily.     nortriptyline (PAMELOR) 10 MG capsule Take 10 mg by mouth at bedtime.     Omega-3 Fatty Acids (FISH OIL) 1200 MG CAPS Take 1,200 mg by mouth daily.     tadalafil (CIALIS) 10 MG tablet Take 1 tablet (10 mg total) by mouth daily as needed for erectile dysfunction. 30 tablet 5   tamsulosin (FLOMAX) 0.4 MG CAPS capsule TAKE 1 CAPSULE BY MOUTH EVERY DAY 90 capsule 1   Testosterone 20 % CREA Apply 1 application topically daily.     Vitamin D, Cholecalciferol, 25 MCG (1000 UT) TABS Take 1,000 Units by mouth daily.     Vitamin E 180 MG (400 UNIT) CAPS Take 400 Units by mouth daily.     No current facility-administered medications for this visit.    OBJECTIVE: Vitals:   01/16/24 1450  BP: 108/70  Pulse: (!) 59  Resp: 18  Temp: 98 F (36.7 C)  SpO2: 98%     Body mass index is 37.59 kg/m.    ECOG FS:0 - Asymptomatic  General: Well-developed, well-nourished, no acute distress. Eyes: Pink conjunctiva, anicteric sclera. HEENT: Normocephalic, moist mucous membranes. Lungs: No audible wheezing or coughing. Heart: Regular rate and rhythm. Abdomen: Soft, nontender, no obvious distention. Musculoskeletal: No edema, cyanosis, or clubbing. Neuro: Alert, answering all questions appropriately. Cranial nerves grossly intact. Skin: No rashes or petechiae noted. Psych: Normal affect.  LAB RESULTS:  Lab Results  Component Value Date   NA 145 06/14/2020    K 3.7 06/14/2020   CL 108 06/14/2020   CO2 26 06/14/2020   GLUCOSE 115 (H) 06/14/2020   BUN 7 (L) 06/14/2020   CREATININE 1.10 06/14/2020   CALCIUM 8.0 (L) 06/14/2020   PROT 6.5 06/14/2020   ALBUMIN 3.1 (L) 06/14/2020   AST 43 (H) 06/14/2020   ALT 35 06/14/2020   ALKPHOS 80 06/14/2020   BILITOT 0.9 06/14/2020   GFRNONAA >60 06/14/2020   GFRAA >60 06/14/2020    Lab Results  Component Value Date   WBC 6.8 01/02/2024   NEUTROABS 2.5 06/14/2020   HGB 17.3 (H) 01/02/2024   HCT 53.4 (H) 01/02/2024   MCV 99.1 01/02/2024   PLT 246 01/02/2024   Lab Results  Component Value Date   IRON 57 01/02/2024   TIBC 477 (H) 01/02/2024   IRONPCTSAT 12 (L) 01/02/2024   Lab Results  Component Value Date   FERRITIN 29 01/02/2024     STUDIES: No results found.  ASSESSMENT: Polycythemia.  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  Patient's hemoglobin remains elevated at 17.3.  Other than an inappropriately elevated erythropoietin level, all of his other laboratory work including iron stores, carbon monoxide level, and JAK2 mutation with reflex are either negative or within normal limits.  Patient will benefit from phlebotomy with a goal hemoglobin of 17.0.  Proceed to treatment today.  Return to clinic in 4 weeks for repeat laboratory, further evaluation, and additional phlebotomy if necessary.   Low testosterone: Continue testosterone cream as prescribed. Inappropriately elevated erythropoietin level: Will get CT scan of the abdomen and pelvis for further evaluation.  I spent a total of 30 minutes reviewing chart data, face-to-face evaluation with the patient, counseling and coordination of care as detailed above.  Patient expressed understanding and was in agreement with this plan. He also understands that He can call clinic at any time with any questions, concerns, or complaints.     Jeralyn Ruths, MD   01/16/2024 3:01 PM

## 2024-01-16 NOTE — Patient Instructions (Signed)
 Therapeutic Phlebotomy Discharge Instructions  - Increase your fluid intake over the next 4 hours  - No smoking for 30 minutes  - Avoid using the affected arm (the one you had the blood drawn from) for heavy lifting or other activities.  - You may resume all normal activities after 30 minutes.  You are to notify the office if you experience:   - Persistent dizziness and/or lightheadedness -Uncontrolled or excessive bleeding at the site.  Therapeutic Phlebotomy Therapeutic phlebotomy is the planned removal of blood from a person's body for the purpose of treating a medical condition. The procedure is lot like donating blood. Usually, about a pint (470 mL, or 0.47 L) of blood is removed. The average adult has 9-12 pints (4.3-5.7 L) of blood in his or her body. Therapeutic phlebotomy may be used to treat the following medical conditions: Hemochromatosis. This is a condition in which the blood contains too much iron. Polycythemia vera. This is a condition in which the blood contains too many red blood cells. Porphyria cutanea tarda. This is a disease in which an important part of hemoglobin is not made properly. It results in the buildup of abnormal amounts of porphyrins in the body. Sickle cell disease. This is a condition in which the red blood cells form an abnormal crescent shape rather than a round shape. Tell a health care provider about: Any allergies you have. All medicines you are taking, including vitamins, herbs, eye drops, creams, and over-the-counter medicines. Any bleeding problems you have. Any surgeries you have had. Any medical conditions you have. Whether you are pregnant or may be pregnant. What are the risks? Generally, this is a safe procedure. However, problems may occur, including: Nausea or light-headedness. Low blood pressure (hypotension). Soreness, bleeding, swelling, or bruising at the needle insertion site. Infection. What happens before the procedure? Ask  your health care provider about: Changing or stopping your regular medicines. This is especially important if you are taking diabetes medicines or blood thinners. Taking medicines such as aspirin and ibuprofen. These medicines can thin your blood. Do not take these medicines unless your health care provider tells you to take them. Taking over-the-counter medicines, vitamins, herbs, and supplements. Wear clothing with sleeves that can be raised above the elbow. You may have a blood sample taken. Your blood pressure, pulse rate, and breathing rate will be measured. What happens during the procedure?  You may be given a medicine to numb the area (local anesthetic). A tourniquet will be placed on your arm. A needle will be put into one of your veins. Tubing and a collection bag will be attached to the needle. Blood will flow through the needle and tubing into the collection bag. The collection bag will be placed lower than your arm so gravity can help the blood flow into the bag. You may be asked to open and close your hand slowly and continually during the entire collection. After the specified amount of blood has been removed from your body, the collection bag and tubing will be clamped. The needle will be removed from your vein. Pressure will be held on the needle site to stop the bleeding. A bandage (dressing) will be placed over the needle insertion site. The procedure may vary among health care providers and hospitals. What happens after the procedure? Your blood pressure, pulse rate, and breathing rate will be measured after the procedure. You will be encouraged to drink fluids. You will be encouraged to eat a snack to prevent a low  blood sugar level. Your recovery will be assessed and monitored. Return to your normal activities as told by your health care provider. Summary Therapeutic phlebotomy is the planned removal of blood from a person's body for the purpose of treating a medical  condition. Therapeutic phlebotomy may be used to treat hemochromatosis, polycythemia vera, porphyria cutanea tarda, or sickle cell disease. In the procedure, a needle is inserted and about a pint (470 mL, or 0.47 L) of blood is removed. The average adult has 9-12 pints (4.3-5.7 L) of blood in the body. This is generally a safe procedure, but it can sometimes cause problems such as nausea, light-headedness, or low blood pressure (hypotension). This information is not intended to replace advice given to you by your health care provider. Make sure you discuss any questions you have with your health care provider. Document Revised: 03/24/2021 Document Reviewed: 03/24/2021 Elsevier Patient Education  2024 [employer].

## 2024-01-18 ENCOUNTER — Encounter (HOSPITAL_COMMUNITY): Payer: Self-pay

## 2024-01-18 ENCOUNTER — Encounter: Payer: Self-pay | Admitting: Oncology

## 2024-01-18 ENCOUNTER — Ambulatory Visit (INDEPENDENT_AMBULATORY_CARE_PROVIDER_SITE_OTHER): Admitting: Licensed Clinical Social Worker

## 2024-01-18 DIAGNOSIS — F1020 Alcohol dependence, uncomplicated: Secondary | ICD-10-CM | POA: Diagnosis not present

## 2024-01-18 DIAGNOSIS — Z720 Tobacco use: Secondary | ICD-10-CM

## 2024-01-18 NOTE — Progress Notes (Signed)
 THERAPIST PROGRESS NOTE  Session Time: 11 a.m. to 12:10 p.m.   Type of Therapy: Individual   Therapist Response/Interventions: Motivational Interviewing/The therapist discusses with [name] the risks associated with detoxing on his own recommending that he check his blood pressure regularly if his plan is to self-taper versus going inpatient for detox.  The therapist assists [name] in evaluating the pros and cons of drinking and educates him on the stages of relapse; emotional, mental, and physical, as he is apparently unfamiliar with them.   Treatment Goals addressed:  Active     Substance Use     [name] will taper completely off of alcohol and return to complete abstinence while making his Sponsor and other sober supports in AA aware of his recent relapse.  (Initial)     Start:  01/18/24         The therapist will assist [name] in being able to identify and avoid triggers for using and assist him in moving from the Contemplation Stage of change into the action stage.      Start:  01/18/24            Summary: [name] presents today saying that he has cut back from three cans per day to two cans per day; however, he has still not told his Sponsor or others in AA that he has been drinking thinking in his head that they would try to talk him into stopping completely.  The therapist talks to [employer] about the fact that he is in the process of tapering off alcohol so actually is in the process of stopping. [name] is agreeable to telling his Sponsor by the conclusion of this session.  He says that he does not even know why he went back to drinking after 2 years as things were going well. He says that perhaps it was boredom and the therapist and [name] also talk about his introversion and anxiety in groups.   Progress Towards Goals: Initial  Suicidal/Homicidal: No SI or HI  Plan: Return again in 2 weeks per his request.   Diagnosis: Alcohol Use Disorder, Severe and Chewing tobacco use  Collaboration  of Care: Other N/A  Patient/Guardian was advised Release of Information must be obtained prior to any record release in order to collaborate their care with an outside provider. Patient/Guardian was advised if they have not already done so to contact the registration department to sign all necessary forms in order for [place] to release information regarding their care.   Consent: Patient/Guardian gives verbal consent for treatment and assignment of benefits for services provided during this visit. Patient/Guardian expressed understanding and agreed to proceed.   Myrna Blazer, MA, LCSW, [facility], LCAS 01/18/2024

## 2024-01-23 ENCOUNTER — Ambulatory Visit
Admission: RE | Admit: 2024-01-23 | Discharge: 2024-01-23 | Disposition: A | Discharge status: Home / Self Care | Source: Ambulatory Visit | Attending: Oncology | Admitting: Oncology

## 2024-01-23 ENCOUNTER — Encounter: Payer: Self-pay | Admitting: Oncology

## 2024-01-23 DIAGNOSIS — D751 Secondary polycythemia: Secondary | ICD-10-CM

## 2024-01-23 MED ORDER — IOPAMIDOL (ISOVUE-300) INJECTION 61%
100.0000 mL | Freq: Once | INTRAVENOUS | Status: AC | PRN
  Administered 2024-01-23: 100 mL via INTRAVENOUS

## 2024-02-01 ENCOUNTER — Ambulatory Visit (INDEPENDENT_AMBULATORY_CARE_PROVIDER_SITE_OTHER): Admitting: Licensed Clinical Social Worker

## 2024-02-01 DIAGNOSIS — F1020 Alcohol dependence, uncomplicated: Secondary | ICD-10-CM | POA: Diagnosis not present

## 2024-02-01 DIAGNOSIS — Z720 Tobacco use: Secondary | ICD-10-CM

## 2024-02-01 NOTE — Progress Notes (Signed)
 THERAPIST PROGRESS NOTE  Session Time: 11:10 a.m. to 12 p.m.   Type of Therapy: Individual   Therapist Response/Interventions: Motivational Interviewing/The therapist discusses educates Alexander Mendoza on the diagnostic criteria for having a substance use disorder noting that he has six of these symptoms with three or more being a severe use disorder. He points out that Alexander Mendoza continues to drink two drinks a day as he says that it helps him relax a little in spite of the fact that his drinking upsets his wife such that his relaxing a little bit appears more important than his wife's feelings.  The therapist observes that Alexander Mendoza asked his wife previously about his drinking in the past and she explained what he did, such as hollering at her; however, she did not tell him how his actions made her feels. Thus, the therapist adds that it might be beneficial to have her come to a session to share what this was like for her emotionally as Alexander Mendoza admits that he tells himself that she is blowing things out of proportion when being upset about his drinking. The therapist goes over the tasks people must achieve in the abstinence stage of recovery noting that Alexander Mendoza has done well with some; however, has not managed others such as learning how to relax without having to use chemicals.   Treatment Goals addressed:  Active     Substance Use     Alexander Mendoza will taper completely off of alcohol and return to complete abstinence while making his Sponsor and other sober supports in AA aware of his recent relapse.  (Not Progressing)     Start:  01/18/24         The therapist will assist Alexander Mendoza in being able to identify and avoid triggers for using and assist him in moving from the Contemplation Stage of change into the action stage.      Start:  01/18/24               Summary: Alexander Mendoza presents saying that he is still drinking two cans per day having not tapered any further than that and has not told his Sponsor. He says that he needs to  quit but does not want to quit. He says that he was going to tell his Sponsor but when he approached him, his Sponsor introduced him to another guy as being his "star Sponsee."   The major focus of the session involves the reason that [name]'s wife is concerning about his drinking progressing to where it was in the past and how his actions while drinking in the past impacted her emotionally such that Alexander Mendoza can weigh this against his desire to relax a little.   Progress Towards Goals: Not progressing  Suicidal/Homicidal: No SI or HI  Plan: Return again in 2 weeks per his request.   Diagnosis: Alcohol Use Disorder, Severe and Chewing tobacco use  Collaboration of Care: He will talk with his wife about attending his next session.   Patient/Guardian was advised Release of Information must be obtained prior to any record release in order to collaborate their care with an outside provider. Patient/Guardian was advised if they have not already done so to contact the registration department to sign all necessary forms in order for us  to release information regarding their care.   Consent: Patient/Guardian gives verbal consent for treatment and assignment of benefits for services provided during this visit. Patient/Guardian expressed understanding and agreed to proceed.   Melynda Stagger, MA, LCSW, [facility], LCAS 02/01/2024

## 2024-02-15 ENCOUNTER — Inpatient Hospital Stay: Attending: Oncology

## 2024-02-15 ENCOUNTER — Ambulatory Visit (INDEPENDENT_AMBULATORY_CARE_PROVIDER_SITE_OTHER): Admitting: Licensed Clinical Social Worker

## 2024-02-15 ENCOUNTER — Inpatient Hospital Stay

## 2024-02-15 ENCOUNTER — Inpatient Hospital Stay: Admitting: Oncology

## 2024-02-15 ENCOUNTER — Encounter: Payer: Self-pay | Admitting: Oncology

## 2024-02-15 ENCOUNTER — Ambulatory Visit (HOSPITAL_COMMUNITY): Admitting: Licensed Clinical Social Worker

## 2024-02-15 VITALS — BP 118/75 | HR 62 | Temp 97.1°F | Resp 17 | Wt 244.0 lb

## 2024-02-15 DIAGNOSIS — Z803 Family history of malignant neoplasm of breast: Secondary | ICD-10-CM | POA: Insufficient documentation

## 2024-02-15 DIAGNOSIS — Z720 Tobacco use: Secondary | ICD-10-CM

## 2024-02-15 DIAGNOSIS — Z8043 Family history of malignant neoplasm of testis: Secondary | ICD-10-CM | POA: Insufficient documentation

## 2024-02-15 DIAGNOSIS — D751 Secondary polycythemia: Secondary | ICD-10-CM | POA: Diagnosis present

## 2024-02-15 DIAGNOSIS — Z79890 Hormone replacement therapy (postmenopausal): Secondary | ICD-10-CM | POA: Insufficient documentation

## 2024-02-15 DIAGNOSIS — F1020 Alcohol dependence, uncomplicated: Secondary | ICD-10-CM

## 2024-02-15 DIAGNOSIS — E291 Testicular hypofunction: Secondary | ICD-10-CM | POA: Diagnosis not present

## 2024-02-15 LAB — CBC WITH DIFFERENTIAL/PLATELET
Abs Immature Granulocytes: 0.03 10*3/uL (ref 0.00–0.07)
Basophils Absolute: 0.1 10*3/uL (ref 0.0–0.1)
Basophils Relative: 1 %
Eosinophils Absolute: 0.3 10*3/uL (ref 0.0–0.5)
Eosinophils Relative: 4 %
HCT: 50.5 % (ref 39.0–52.0)
Hemoglobin: 16.4 g/dL (ref 13.0–17.0)
Immature Granulocytes: 0 %
Lymphocytes Relative: 19 %
Lymphs Abs: 1.3 10*3/uL (ref 0.7–4.0)
MCH: 32 pg (ref 26.0–34.0)
MCHC: 32.5 g/dL (ref 30.0–36.0)
MCV: 98.4 fL (ref 80.0–100.0)
Monocytes Absolute: 0.7 10*3/uL (ref 0.1–1.0)
Monocytes Relative: 10 %
Neutro Abs: 4.5 10*3/uL (ref 1.7–7.7)
Neutrophils Relative %: 66 %
Platelets: 215 10*3/uL (ref 150–400)
RBC: 5.13 MIL/uL (ref 4.22–5.81)
RDW: 13.2 % (ref 11.5–15.5)
WBC: 6.8 10*3/uL (ref 4.0–10.5)
nRBC: 0 % (ref 0.0–0.2)

## 2024-02-15 NOTE — Progress Notes (Signed)
 [facility]  [phone] [phone]  ID: Alexander Mendoza OB: June 16, 1961  MR#: [id number]  NUU#:[id number]  Patient Care Team: Jimmy Moulding, MD as PCP - General (Internal Medicine) Shellie Dials, MD as Consulting Physician (Oncology)  CHIEF COMPLAINT: Secondary polycythemia.  INTERVAL HISTORY: Patient returns to clinic today for repeat laboratory work and further evaluation.  He continues to feel well and remains asymptomatic.  He does not complain of any weakness or fatigue today.  He has no neurologic complaints.  He denies any recent fevers or illnesses.  He has a good appetite and denies weight loss.  He denies any chest pain, shortness of breath, cough, or hemoptysis.  He denies any nausea, vomiting, constipation, or diarrhea.  He has no urinary complaints.  Patient offers no specific complaints today.  REVIEW OF SYSTEMS:   Review of Systems  Constitutional: Negative.  Negative for fever, malaise/fatigue and weight loss.  Respiratory: Negative.  Negative for cough and shortness of breath.   Cardiovascular: Negative.  Negative for chest pain and leg swelling.  Gastrointestinal: Negative.  Negative for abdominal pain.  Genitourinary: Negative.  Negative for dysuria.  Musculoskeletal: Negative.  Negative for back pain.  Skin: Negative.  Negative for rash.  Neurological: Negative.  Negative for dizziness, focal weakness, weakness and headaches.  Psychiatric/Behavioral: Negative.  The patient is not nervous/anxious.     As per HPI. Otherwise, a complete review of systems is negative.  PAST MEDICAL HISTORY: Past Medical History:  Diagnosis Date   Allergy    Anxiety 04/04/2021   Chronic kidney disease    GERD (gastroesophageal reflux disease)    Headache disorder 05/30/2020   Improved with nortryptiline   Hyperlipidemia    Hypertension     PAST SURGICAL HISTORY: Past Surgical History:  Procedure Laterality Date   COLONOSCOPY      PROSTATE BIOPSY N/A 03/25/2021   Procedure: PROSTATE BIOPSY Ali Ink;  Surgeon: Rea Cambridge, MD;  Location: [facility] ORS;  Service: Urology;  Laterality: N/A;   VASECTOMY     WISDOM TOOTH EXTRACTION      FAMILY HISTORY: Family History  Problem Relation Age of Onset   Cancer Mother        Breast   Hypertension Father    Cancer Brother        Testicular    ADVANCED DIRECTIVES (Y/N):  N  HEALTH MAINTENANCE: Social History   Tobacco Use   Smoking status: Never   Smokeless tobacco: Current    Types: Snuff  Vaping Use   Vaping status: Never Used  Substance Use Topics   Alcohol use: Yes    Alcohol/week: 6.0 standard drinks of alcohol    Types: 6 Cans of beer per week    Comment: Last drink Friday afternoon, 3 mix drinks/day   Drug use: No     Colonoscopy:  PAP:  Bone density:  Lipid panel:  Allergies  Allergen Reactions   Benazepril Cough    Current Outpatient Medications  Medication Sig Dispense Refill   albuterol  (PROVENTIL  HFA;VENTOLIN  HFA) 108 (90 Base) MCG/ACT inhaler Inhale 1 puff into the lungs every 6 (six) hours as needed for wheezing or shortness of breath. 1 Inhaler 3   AMBULATORY NON FORMULARY MEDICATION Testosterone  20% cream   90ml   Apply 1 ml to upper arms or thighs or behind knees daily.   Quantity: 1  Refill: 0  [facility] at  Phone:[phone] Fax: [phone] 90 mL 0   amLODipine  (NORVASC )  5 MG tablet TAKE 1 TABLET BY MOUTH EVERY DAY (Patient taking differently: Take 5 mg by mouth daily.) 30 tablet 0   ARIPiprazole (ABILIFY) 10 MG tablet Take 10 mg by mouth daily.     azelastine  (ASTELIN ) 0.1 % nasal spray Place 1 spray into both nostrils daily as needed for rhinitis. 30 mL 12   DEXILANT  60 MG capsule TAKE 1 CAPSULE BY MOUTH EVERY DAY (Patient taking differently: Take 60 mg by mouth daily.) 90 capsule 1   escitalopram (LEXAPRO) 20 MG tablet Take 20 mg by mouth daily.     fluticasone  (FLONASE ) 50 MCG/ACT nasal  spray Place 2 sprays into both nostrils daily as needed for allergies or rhinitis. 16 g 12   folic acid  (FOLVITE ) 800 MCG tablet Take 800 mcg by mouth daily.     levocetirizine (XYZAL ) 5 MG tablet Take 5 mg by mouth every evening.     losartan  (COZAAR ) 100 MG tablet TAKE 1 TABLET BY MOUTH EVERY DAY (Patient taking differently: Take 100 mg by mouth daily.) 90 tablet 1   MAGNESIUM CITRATE PO Take 160 mg by mouth daily.     Multiple Vitamin (MULTI-VITAMINS) TABS Take 1 tablet by mouth daily.     naltrexone (DEPADE) 50 MG tablet Take 50 mg by mouth daily.     nortriptyline  (PAMELOR ) 10 MG capsule Take 10 mg by mouth at bedtime.     Omega-3 Fatty Acids (FISH OIL) 1200 MG CAPS Take 1,200 mg by mouth daily.     tadalafil  (CIALIS ) 10 MG tablet Take 1 tablet (10 mg total) by mouth daily as needed for erectile dysfunction. 30 tablet 5   tamsulosin  (FLOMAX ) 0.4 MG CAPS capsule TAKE 1 CAPSULE BY MOUTH EVERY DAY 90 capsule 1   Testosterone  20 % CREA Apply 1 application topically daily.     Vitamin D, Cholecalciferol, 25 MCG (1000 UT) TABS Take 1,000 Units by mouth daily.     Vitamin E 180 MG (400 UNIT) CAPS Take 400 Units by mouth daily.     No current facility-administered medications for this visit.    OBJECTIVE: Vitals:   02/15/24 1517  BP: 118/75  Pulse: 62  Resp: 17  Temp: (!) 97.1 F (36.2 C)  SpO2: 100%     Body mass index is 37.1 kg/m.    ECOG FS:0 - Asymptomatic  General: Well-developed, well-nourished, no acute distress. Eyes: Pink conjunctiva, anicteric sclera. HEENT: Normocephalic, moist mucous membranes. Lungs: No audible wheezing or coughing. Heart: Regular rate and rhythm. Abdomen: Soft, nontender, no obvious distention. Musculoskeletal: No edema, cyanosis, or clubbing. Neuro: Alert, answering all questions appropriately. Cranial nerves grossly intact. Skin: No rashes or petechiae noted. Psych: Normal affect.  LAB RESULTS:  Lab Results  Component Value Date   NA 145  06/14/2020   K 3.7 06/14/2020   CL 108 06/14/2020   CO2 26 06/14/2020   GLUCOSE 115 (H) 06/14/2020   BUN 7 (L) 06/14/2020   CREATININE 1.10 06/14/2020   CALCIUM 8.0 (L) 06/14/2020   PROT 6.5 06/14/2020   ALBUMIN 3.1 (L) 06/14/2020   AST 43 (H) 06/14/2020   ALT 35 06/14/2020   ALKPHOS 80 06/14/2020   BILITOT 0.9 06/14/2020   GFRNONAA >60 06/14/2020   GFRAA >60 06/14/2020    Lab Results  Component Value Date   WBC 6.8 02/15/2024   NEUTROABS 4.5 02/15/2024   HGB 16.4 02/15/2024   HCT 50.5 02/15/2024   MCV 98.4 02/15/2024   PLT 215 02/15/2024   Lab Results  Component Value Date   IRON 57 01/02/2024   TIBC 477 (H) 01/02/2024   IRONPCTSAT 12 (L) 01/02/2024   Lab Results  Component Value Date   FERRITIN 29 01/02/2024     STUDIES: CT ABDOMEN PELVIS W CONTRAST Result Date: 01/30/2024 CLINICAL DATA:  elevated hemoglobin, elevated erythropoetin EXAM: CT ABDOMEN AND PELVIS WITH CONTRAST TECHNIQUE: Multidetector CT imaging of the abdomen and pelvis was performed using the standard protocol following bolus administration of intravenous contrast. RADIATION DOSE REDUCTION: This exam was performed according to the departmental dose-optimization program which includes automated exposure control, adjustment of the mA and/or kV according to patient size and/or use of iterative reconstruction technique. CONTRAST:  ISOVUE -300 IOPAMIDOL  (ISOVUE -300) INJECTION 61% COMPARISON:  Liver ultrasound 09/10/2020 FINDINGS: Lower chest: No acute abnormality. Hepatobiliary: Normal liver size and contour with diffuse steatosis. No focal liver lesion. Normal gallbladder. No biliary ductal dilation. Pancreas: Unremarkable. No pancreatic ductal dilatation or surrounding inflammatory changes. Spleen: Normal in size without focal abnormality. Adrenals/Urinary Tract: Adrenal glands are unremarkable. Kidneys are normal, without renal calculi, focal lesion, or hydronephrosis. Simple left cortical cyst. Bladder is  unremarkable. Stomach/Bowel: Stomach is within normal limits. Appendix appears normal. No evidence of bowel wall thickening, distention, or inflammatory changes. Vascular/Lymphatic: Aortic atherosclerosis. No enlarged abdominal or pelvic lymph nodes. Reproductive: Mild prostatomegaly. Other: Bilateral fat containing inguinal hernias.  No ascites. Musculoskeletal: No acute or significant osseous findings. Multilevel degenerative changes. IMPRESSION: 1. No acute abnormality in the abdomen or pelvis. 2. Hepatic steatosis. Electronically Signed   By: Rox Cope M.D.   On: 01/30/2024 10:17    ASSESSMENT: Secondary polycythemia.  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  Patient received phlebotomy 1 month ago and his hemoglobin is now decreased and within normal limits at 16.4.  He had an inappropriately elevated erythropoietin  level, but CT scan of the abdomen and pelvis on January 30, 2024 did not reveal any significant pathology.  All of his other laboratory work including iron stores, carbon monoxide level, and JAK2 mutation with reflex are either negative or within normal limits.  Patient has a goal hemoglobin of 17.0, therefore does not require phlebotomy today.  Return to clinic in 3 months with repeat laboratory, further evaluation, and continuation of treatment if needed. Low testosterone : Continue testosterone  cream as prescribed. Inappropriately elevated erythropoietin  level: Negative CT scan as above.  No intervention needed.  I spent a total of 20 minutes reviewing chart data, face-to-face evaluation with the patient, counseling and coordination of care as detailed above.   Patient expressed understanding and was in agreement with this plan. He also understands that He can call clinic at any time with any questions, concerns, or complaints.     Shellie Dials, MD   02/15/2024 3:40 PM
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
## 2024-02-15 NOTE — Progress Notes (Signed)
 No phlebotomy today meets hbg parameters

## 2024-02-16 ENCOUNTER — Encounter: Payer: Self-pay | Admitting: Oncology

## 2024-02-18 LAB — ERYTHROPOIETIN: Erythropoietin: 11.3 m[IU]/mL (ref 2.6–18.5)

## 2024-02-29 ENCOUNTER — Ambulatory Visit (INDEPENDENT_AMBULATORY_CARE_PROVIDER_SITE_OTHER): Admitting: Licensed Clinical Social Worker

## 2024-02-29 DIAGNOSIS — F1020 Alcohol dependence, uncomplicated: Secondary | ICD-10-CM

## 2024-02-29 DIAGNOSIS — F17220 Nicotine dependence, chewing tobacco, uncomplicated: Secondary | ICD-10-CM | POA: Diagnosis not present

## 2024-02-29 DIAGNOSIS — Z720 Tobacco use: Secondary | ICD-10-CM

## 2024-02-29 NOTE — Progress Notes (Signed)
 THERAPIST PROGRESS NOTE  Session Time: 3:10 p.m.  to 3:55 p.m.  Type of Therapy: Individual   Therapist Response/Interventions: Solution-Focused/the therapist talks to Alexander Mendoza about the importance of scheduling and suggest that even though he likes in person meetings that he could take advantage of virtual meetings for times when he finds himself idle with nothing to do in too much time on his hands.  The therapist also makes him aware of baclofen as another potential option to help with alcohol related cravings.  Treatment Goals addressed:  Active     Substance Use     Alexander Mendoza will taper completely off of alcohol and return to complete abstinence while making his Sponsor and other sober supports in AA aware of his recent relapse.  (Progressing)     Start:  01/18/24         The therapist will assist Alexander Mendoza in being able to identify and avoid triggers for using and assist him in moving from the Contemplation Stage of change into the action stage.      Start:  01/18/24                     Summary: Alexander Mendoza presents saying that he talked to his Sponsor and picked up a white chip.  His affect appears brighter than in previous sessions and he reports improvement in his mood in the form of decreased anxiety.  He says that he will start working step 1 again with his sponsor.  He is attending several AA meetings per week in addition to attending AA celebrate recovery meetings once a week.  He says that he recently attended a celebrate recovery meeting and was upset to learn that a person at the meeting took a picture of him and posted it on his social media account.  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  In addition to attending meetings, he says that he occasionally spends time with family.  He also does some woodworking in his shop.  Alexander Mendoza says that he will still have some thoughts about using mainly when he has time on his hands late in the evening.  Says that he  was unable to tolerate the naltrexone.  By the conclusion of the session, he says that he may take advantage of virtual AA meetings for times when he is alone in the evening and that he may also talk to his medication prescriber about baclofen if he needs medication assisted treatment for alcohol cravings.  He says that he would like to continue seeing this therapist as he finds the sessions helpful.   Progress Towards Goals: Progressing  Suicidal/Homicidal: No SI or HI  Plan: Return again in 3 weeks.  Diagnosis: Alcohol Use Disorder, Severe and Chewing tobacco use  Collaboration of Care: Wife joins today's session as a collateral.   Patient/Guardian was advised Release of Information must be obtained prior to any record release in order to collaborate their care with an outside provider. Patient/Guardian was advised if they have not already done so to contact the registration department to sign all necessary forms in order for us  to release information regarding their care.   Consent: Patient/Guardian gives verbal consent for treatment and assignment of benefits for services provided during this visit. Patient/Guardian expressed understanding and agreed to proceed.   Alexander Stagger, MA, LCSW, [facility], LCAS 02/29/2024

## 2024-03-21 ENCOUNTER — Ambulatory Visit (INDEPENDENT_AMBULATORY_CARE_PROVIDER_SITE_OTHER): Admitting: Licensed Clinical Social Worker

## 2024-03-21 DIAGNOSIS — Z720 Tobacco use: Secondary | ICD-10-CM

## 2024-03-21 DIAGNOSIS — F1020 Alcohol dependence, uncomplicated: Secondary | ICD-10-CM

## 2024-03-21 NOTE — Progress Notes (Signed)
 THERAPIST PROGRESS NOTE  Session Time: 3:05 p.m. to 4 p.m.  Type of Therapy: Individual   Therapist Response/Interventions: Solution-Focused/the therapist talks to Alexander Mendoza about the first step as being not just a thinking step but an action step praising him for having made the decision to avoid encountering some triggers.  The therapist encourages him to get in sooner than August concerning the medication assisted treatment and encourages him also to pick up the phone and call people in his support system and let them know when he is having cravings.  The therapist continues to educate him about addiction as being a biological disease discussing dopamine as a chemical of pursuit and anticipation rather than pleasure.  Treatment Goals addressed:  Active     Substance Use     Alexander Mendoza will taper completely off of alcohol and return to complete abstinence while making his Sponsor and other sober supports in AA aware of his recent relapse.  (Progressing)     Start:  01/18/24         The therapist will assist Alexander Mendoza in being able to identify and avoid triggers for using and assist him in moving from the Contemplation Stage of change into the action stage.      Start:  01/18/24                        Summary: Alexander Mendoza returns saying that he is on day 27 and now has a new green chip to go with his white. He is still working on Step One with his Marketing executive.  Alexander Mendoza reports that his mood has been pretty good but that he is having a lot of alcohol cravings but they are only lasting for short period of time.  He says that he hopes that his primary care doctor will start him on baclofen but does not have an appointment with him until August of this year.  Alexander Mendoza says that he recently had to go somewhere for his wife but picked a different location so as to avoid going past some places that would be a trigger to drink.  He says that he will be going on a golfing trip in the near future and that 4thJuly is not  a big problem for him but that being around a fire in colder months is a trigger to drink.  Progress Towards Goals: Progressing  Suicidal/Homicidal: No SI or HI  Plan: Alexander Mendoza says that he would like to return at least 1 more time to see this therapist in about 3 weeks.  He will contact his primary care doctor about getting an appointment sooner than August so as to talk to him about possibly starting baclofen for his alcohol cravings.  If he cannot get in with his doctor sooner, then he will call this therapist back as he may be able to get him seen by the PA-C in this office.  Diagnosis: Alcohol Use Disorder, Severe and Chewing tobacco use  Collaboration of Care: N/A  Patient/Guardian was advised Release of Information must be obtained prior to any record release in order to collaborate their care with an outside provider. Patient/Guardian was advised if they have not already done so to contact the registration department to sign all necessary forms in order for us  to release information regarding their care.   Consent: Patient/Guardian gives verbal consent for treatment and assignment of benefits for services provided during this visit. Patient/Guardian expressed understanding and agreed to proceed.   Melynda Stagger,  MA, LCSW, [facility], LCAS 03/21/2024

## 2024-04-11 ENCOUNTER — Ambulatory Visit (INDEPENDENT_AMBULATORY_CARE_PROVIDER_SITE_OTHER): Admitting: Licensed Clinical Social Worker

## 2024-04-11 DIAGNOSIS — F1020 Alcohol dependence, uncomplicated: Secondary | ICD-10-CM | POA: Diagnosis not present

## 2024-04-11 DIAGNOSIS — Z720 Tobacco use: Secondary | ICD-10-CM

## 2024-04-11 NOTE — Progress Notes (Signed)
 THERAPIST PROGRESS NOTE  Session Time: 3 p.m. to 4 p.m.   Type of Therapy: Individual   Therapist Response/Interventions: Solution-Focused and CBT/the therapist talks to Alexander Mendoza about internal and external triggers providing him handouts to complete to help identifying what these are for him.  The therapist also educates Alexander Mendoza on how to use the technique of playing the story forward and how to identify and dispute the voice of addiction.   Treatment Goals addressed:  Active     Substance Use     Alexander Mendoza will taper completely off of alcohol and return to complete abstinence while making his Sponsor and other sober supports in AA aware of his recent relapse.  (Not Progressing)     Start:  01/18/24         The therapist will assist Alexander Mendoza in being able to identify and avoid triggers for using and assist him in moving from the Contemplation Stage of change into the action stage.      Start:  01/18/24                           Summary: Alexander Mendoza presents today saying that he drank 7 days ago and that he is now motivated as his wife has left him and is staying at her brother's house.  He has spoken to his sponsor about this and has picked up a new white chip.    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  Additionally, he says that an emotional trigger may be due to some sexual dysfunction which he is experiencing.  He has tried medications for this but they have not been effective such that his doctor has talked to him about using an injectable medication; however, Alexander Mendoza admits that he has not followed through on this due to some catastrophizing.  Progress Towards Goals: Not progressing  Suicidal/Homicidal: No SI or HI  Plan: Alexander Mendoza will return in 3 weeks per his request and will continue to attend AA and work with his sponsor in the  interim.  Diagnosis: Alcohol Use Disorder, Severe and Chewing tobacco use  Collaboration of Care: N/A  Patient/Guardian was advised Release of Information must be obtained prior to any record release in order to collaborate their care with an outside provider. Patient/Guardian was advised if they have not already done so to contact the registration department to sign all necessary forms in order for us  to release information regarding their care.   Consent: Patient/Guardian gives verbal consent for treatment and assignment of benefits for services provided during this visit. Patient/Guardian expressed understanding and agreed to proceed.   Zell Maier, MA, LCSW, [facility], LCAS 04/11/2024

## 2024-05-02 ENCOUNTER — Ambulatory Visit (HOSPITAL_COMMUNITY): Admitting: Licensed Clinical Social Worker

## 2024-05-09 ENCOUNTER — Ambulatory Visit (INDEPENDENT_AMBULATORY_CARE_PROVIDER_SITE_OTHER): Admitting: Licensed Clinical Social Worker

## 2024-05-09 DIAGNOSIS — F1020 Alcohol dependence, uncomplicated: Secondary | ICD-10-CM | POA: Diagnosis not present

## 2024-05-09 DIAGNOSIS — Z720 Tobacco use: Secondary | ICD-10-CM

## 2024-05-09 NOTE — Progress Notes (Signed)
 THERAPIST PROGRESS NOTE  Session Time: 2:56 p.m. to 3:35 p.m.   Type of Therapy: Individual   Therapist Response/Interventions: Solution-Focused and CBT/the therapist talks to [place] about PAWS and the fact that exercise can reduce the severity and intensity of it. The therapist discusses how stress can exacerbate the symptoms of addiction and prepares him for the fact that he is likely to face challenges in year one of recovery.  The therapist suggests that Alexander Mendoza could hold onto Baclofen as a Plan B should he start having cravings to drink again and encourages him to call if he has issues and needs to get back in with this therapist.   Lastly, the therapist illustrates from a biological standpoint while addiction is doomed to fail.   Treatment Goals addressed:  Active     Substance Use     Alexander Mendoza will taper completely off of alcohol and return to complete abstinence while making his Sponsor and other sober supports in AA aware of his recent relapse.  (Progressing)     Start:  01/18/24         The therapist will assist Alexander Mendoza in being able to identify and avoid triggers for using and assist him in moving from the Contemplation Stage of change into the action stage.      Start:  01/18/24                              Summary: Alexander Mendoza presents saying that his wife stayed away for ten days and came home and everything has been great since then.   He says that his only concern is whether he would stay sober if she ever were to leave him; however, he believes that he would due to his health and other reasons; however, recognizes that there is no likelihood she would actually do so as her only concern was [name]'s drinking and otherwise there are no other complaints.   Alexander Mendoza says that she did not get the Baclofen filled as he has not needed it. When the therapist inquires as to what he is doing for fun, he says that he got a [employer] membership and his starting to take advantage of his Silver  Sneakers.   By session's end, Alexander Mendoza indicates that he will continue to attend meetings and work with his Sponsor and call to schedule with this therapist on a p.r.n. basis.    Progress Towards Goals: Progressing  Suicidal/Homicidal: No SI or HI  Plan: Alexander Mendoza will return again as needed and continue with AA.   Diagnosis: Alcohol Use Disorder, Severe and Chewing tobacco use  Collaboration of Care: N/A  Patient/Guardian was advised Release of Information must be obtained prior to any record release in order to collaborate their care with an outside provider. Patient/Guardian was advised if they have not already done so to contact the registration department to sign all necessary forms in order for us  to release information regarding their care.   Consent: Patient/Guardian gives verbal consent for treatment and assignment of benefits for services provided during this visit. Patient/Guardian expressed understanding and agreed to proceed.   Zell Maier, MA, LCSW, [facility], LCAS 05/09/2024

## 2024-05-15 ENCOUNTER — Other Ambulatory Visit: Payer: Self-pay

## 2024-05-15 DIAGNOSIS — D751 Secondary polycythemia: Secondary | ICD-10-CM

## 2024-05-16 ENCOUNTER — Inpatient Hospital Stay: Attending: Oncology

## 2024-05-16 ENCOUNTER — Encounter: Payer: Self-pay | Admitting: Oncology

## 2024-05-16 ENCOUNTER — Inpatient Hospital Stay (HOSPITAL_BASED_OUTPATIENT_CLINIC_OR_DEPARTMENT_OTHER): Admitting: Oncology

## 2024-05-16 ENCOUNTER — Inpatient Hospital Stay

## 2024-05-16 VITALS — BP 120/78 | HR 73 | Temp 97.0°F | Resp 18 | Ht 68.0 in | Wt 246.0 lb

## 2024-05-16 DIAGNOSIS — D751 Secondary polycythemia: Secondary | ICD-10-CM | POA: Insufficient documentation

## 2024-05-16 DIAGNOSIS — Z803 Family history of malignant neoplasm of breast: Secondary | ICD-10-CM | POA: Diagnosis not present

## 2024-05-16 DIAGNOSIS — E291 Testicular hypofunction: Secondary | ICD-10-CM | POA: Insufficient documentation

## 2024-05-16 DIAGNOSIS — Z8043 Family history of malignant neoplasm of testis: Secondary | ICD-10-CM | POA: Insufficient documentation

## 2024-05-16 LAB — CBC WITH DIFFERENTIAL (CANCER CENTER ONLY)
Abs Immature Granulocytes: 0.02 K/uL (ref 0.00–0.07)
Basophils Absolute: 0.1 K/uL (ref 0.0–0.1)
Basophils Relative: 1 %
Eosinophils Absolute: 0.3 K/uL (ref 0.0–0.5)
Eosinophils Relative: 4 %
HCT: 49.6 % (ref 39.0–52.0)
Hemoglobin: 16.1 g/dL (ref 13.0–17.0)
Immature Granulocytes: 0 %
Lymphocytes Relative: 25 %
Lymphs Abs: 1.8 K/uL (ref 0.7–4.0)
MCH: 30.1 pg (ref 26.0–34.0)
MCHC: 32.5 g/dL (ref 30.0–36.0)
MCV: 92.9 fL (ref 80.0–100.0)
Monocytes Absolute: 0.5 K/uL (ref 0.1–1.0)
Monocytes Relative: 7 %
Neutro Abs: 4.5 K/uL (ref 1.7–7.7)
Neutrophils Relative %: 63 %
Platelet Count: 218 K/uL (ref 150–400)
RBC: 5.34 MIL/uL (ref 4.22–5.81)
RDW: 13.6 % (ref 11.5–15.5)
WBC Count: 7.1 K/uL (ref 4.0–10.5)
nRBC: 0 % (ref 0.0–0.2)

## 2024-05-16 NOTE — Progress Notes (Unsigned)
 [facility]  [phone] [phone]  ID: Alexander Mendoza OB: 07/07/1961  MR#: [id number]  RDW#:[id number]  Patient Care Team: Lenon Layman ORN, MD as PCP - General (Internal Medicine) Jacobo Evalene PARAS, MD as Consulting Physician (Oncology)  CHIEF COMPLAINT: Secondary polycythemia.  INTERVAL HISTORY: Patient returns to clinic today for repeat laboratory, further evaluation, and consideration of phlebotomy.  He continues to feel well and remains asymptomatic.  Continues to take testosterone  supplements and has not changed in dosing.  He does not complain of any weakness or fatigue today. He has no neurologic complaints.  He denies any recent fevers or illnesses.  He has a good appetite and denies weight loss.  He denies any chest pain, shortness of breath, cough, or hemoptysis.  He denies any nausea, vomiting, constipation, or diarrhea.  He has no urinary complaints.  Patient offers no specific complaints today.  REVIEW OF SYSTEMS:   Review of Systems  Constitutional: Negative.  Negative for fever, malaise/fatigue and weight loss.  Respiratory: Negative.  Negative for cough and shortness of breath.   Cardiovascular: Negative.  Negative for chest pain and leg swelling.  Gastrointestinal: Negative.  Negative for abdominal pain.  Genitourinary: Negative.  Negative for dysuria.  Musculoskeletal: Negative.  Negative for back pain.  Skin: Negative.  Negative for rash.  Neurological: Negative.  Negative for dizziness, focal weakness, weakness and headaches.  Psychiatric/Behavioral: Negative.  The patient is not nervous/anxious.     As per HPI. Otherwise, a complete review of systems is negative.  PAST MEDICAL HISTORY: Past Medical History:  Diagnosis Date   Allergy    Anxiety 04/04/2021   Chronic kidney disease    GERD (gastroesophageal reflux disease)    Headache disorder 05/30/2020   Improved with nortryptiline   Hyperlipidemia     Hypertension     PAST SURGICAL HISTORY: Past Surgical History:  Procedure Laterality Date   COLONOSCOPY     PROSTATE BIOPSY N/A 03/25/2021   Procedure: PROSTATE BIOPSY GRAYCE;  Surgeon: Kassie Ozell SAUNDERS, MD;  Location: [facility] ORS;  Service: Urology;  Laterality: N/A;   VASECTOMY     WISDOM TOOTH EXTRACTION      FAMILY HISTORY: Family History  Problem Relation Age of Onset   Cancer Mother        Breast   Hypertension Father    Cancer Brother        Testicular    ADVANCED DIRECTIVES (Y/N):  N  HEALTH MAINTENANCE: Social History   Tobacco Use   Smoking status: Never   Smokeless tobacco: Current    Types: Snuff  Vaping Use   Vaping status: Never Used  Substance Use Topics   Alcohol use: Yes    Alcohol/week: 6.0 standard drinks of alcohol    Types: 6 Cans of beer per week    Comment: Last drink Friday afternoon, 3 mix drinks/day   Drug use: No     Colonoscopy:  PAP:  Bone density:  Lipid panel:  Allergies  Allergen Reactions   Benazepril Cough    Current Outpatient Medications  Medication Sig Dispense Refill   albuterol  (PROVENTIL  HFA;VENTOLIN  HFA) 108 (90 Base) MCG/ACT inhaler Inhale 1 puff into the lungs every 6 (six) hours as needed for wheezing or shortness of breath. 1 Inhaler 3   AMBULATORY NON FORMULARY MEDICATION Testosterone  20% cream   90ml   Apply 1 ml to upper arms or thighs or behind knees daily.   Quantity: 1  Refill: 0  [facility] at  Phone:[phone] Fax: [phone] 90 mL 0   amLODipine  (NORVASC ) 5 MG tablet TAKE 1 TABLET BY MOUTH EVERY DAY (Patient taking differently: Take 5 mg by mouth daily.) 30 tablet 0   ARIPiprazole (ABILIFY) 10 MG tablet Take 10 mg by mouth daily.     azelastine  (ASTELIN ) 0.1 % nasal spray Place 1 spray into both nostrils daily as needed for rhinitis. 30 mL 12   DEXILANT  60 MG capsule TAKE 1 CAPSULE BY MOUTH EVERY DAY (Patient taking differently: Take 60 mg by mouth daily.) 90 capsule 1    escitalopram (LEXAPRO) 20 MG tablet Take 20 mg by mouth daily.     fluticasone  (FLONASE ) 50 MCG/ACT nasal spray Place 2 sprays into both nostrils daily as needed for allergies or rhinitis. 16 g 12   folic acid  (FOLVITE ) 800 MCG tablet Take 800 mcg by mouth daily.     levocetirizine (XYZAL ) 5 MG tablet Take 5 mg by mouth every evening.     losartan  (COZAAR ) 100 MG tablet TAKE 1 TABLET BY MOUTH EVERY DAY (Patient taking differently: Take 100 mg by mouth daily.) 90 tablet 1   MAGNESIUM CITRATE PO Take 160 mg by mouth daily.     Multiple Vitamin (MULTI-VITAMINS) TABS Take 1 tablet by mouth daily.     naltrexone (DEPADE) 50 MG tablet Take 50 mg by mouth daily.     nortriptyline  (PAMELOR ) 10 MG capsule Take 10 mg by mouth at bedtime.     Omega-3 Fatty Acids (FISH OIL) 1200 MG CAPS Take 1,200 mg by mouth daily.     tadalafil  (CIALIS ) 10 MG tablet Take 1 tablet (10 mg total) by mouth daily as needed for erectile dysfunction. 30 tablet 5   tamsulosin  (FLOMAX ) 0.4 MG CAPS capsule TAKE 1 CAPSULE BY MOUTH EVERY DAY 90 capsule 1   Testosterone  20 % CREA Apply 1 application topically daily.     Vitamin D, Cholecalciferol, 25 MCG (1000 UT) TABS Take 1,000 Units by mouth daily.     Vitamin E 180 MG (400 UNIT) CAPS Take 400 Units by mouth daily.     No current facility-administered medications for this visit.    OBJECTIVE: Vitals:   05/16/24 1029  BP: 120/78  Pulse: 73  Resp: 18  Temp: (!) 97 F (36.1 C)  SpO2: 100%     Body mass index is 37.4 kg/m.    ECOG FS:0 - Asymptomatic  General: Well-developed, well-nourished, no acute distress. Eyes: Pink conjunctiva, anicteric sclera. HEENT: Normocephalic, moist mucous membranes. Lungs: No audible wheezing or coughing. Heart: Regular rate and rhythm. Abdomen: Soft, nontender, no obvious distention. Musculoskeletal: No edema, cyanosis, or clubbing. Neuro: Alert, answering all questions appropriately. Cranial nerves grossly intact. Skin: No rashes or  petechiae noted. Psych: Normal affect.  LAB RESULTS:  Lab Results  Component Value Date   NA 145 06/14/2020   K 3.7 06/14/2020   CL 108 06/14/2020   CO2 26 06/14/2020   GLUCOSE 115 (H) 06/14/2020   BUN 7 (L) 06/14/2020   CREATININE 1.10 06/14/2020   CALCIUM 8.0 (L) 06/14/2020   PROT 6.5 06/14/2020   ALBUMIN 3.1 (L) 06/14/2020   AST 43 (H) 06/14/2020   ALT 35 06/14/2020   ALKPHOS 80 06/14/2020   BILITOT 0.9 06/14/2020   GFRNONAA >60 06/14/2020   GFRAA >60 06/14/2020    Lab Results  Component Value Date   WBC 7.1 05/16/2024   NEUTROABS PENDING 05/16/2024   HGB 16.1 05/16/2024   HCT 49.6 05/16/2024  MCV 92.9 05/16/2024   PLT 218 05/16/2024   Lab Results  Component Value Date   IRON 57 01/02/2024   TIBC 477 (H) 01/02/2024   IRONPCTSAT 12 (L) 01/02/2024   Lab Results  Component Value Date   FERRITIN 29 01/02/2024     STUDIES: No results found.   ASSESSMENT: Secondary polycythemia.  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  Resolved.  Patient's hemoglobin continues to be within normal limits at 16.1.  Previously, he had an inappropriately elevated erythropoietin  level, but CT scan of the abdomen and pelvis on January 30, 2024 did not reveal any significant pathology.  All of his other laboratory work including iron stores, carbon monoxide level, and JAK2 mutation with reflex are either negative or within normal limits.  Patient has a goal hemoglobin of less than 17.0, therefore does not require phlebotomy today.  Return to clinic in 6 months with repeat laboratory, further evaluation, and consideration of phlebotomy if necessary.  If patient does not require treatment at that time, he likely can be discharged from clinic. Low testosterone : Continue testosterone  cream as prescribed. Inappropriately elevated erythropoietin  level: Negative CT scan as above.  No intervention needed.  I spent a total of 20 minutes reviewing chart data,  face-to-face evaluation with the patient, counseling and coordination of care as detailed above.   Patient expressed understanding and was in agreement with this plan. He also understands that He can call clinic at any time with any questions, concerns, or complaints.     Evalene JINNY Reusing, MD   05/16/2024 10:49 AM

## 2024-05-16 NOTE — Progress Notes (Signed)
No phlebotomy today.

## 2024-05-16 NOTE — Progress Notes (Unsigned)
 Patient is doing good, no new questions for the doctor today

## 2024-05-17 ENCOUNTER — Encounter: Payer: Self-pay | Admitting: Oncology

## 2024-05-17 LAB — ERYTHROPOIETIN: Erythropoietin: 16.5 m[IU]/mL (ref 2.6–18.5)

## 2024-07-14 ENCOUNTER — Other Ambulatory Visit: Payer: Self-pay | Admitting: Urology

## 2024-07-14 DIAGNOSIS — N401 Enlarged prostate with lower urinary tract symptoms: Secondary | ICD-10-CM

## 2024-07-14 DIAGNOSIS — Z87898 Personal history of other specified conditions: Secondary | ICD-10-CM

## 2024-07-14 DIAGNOSIS — E291 Testicular hypofunction: Secondary | ICD-10-CM

## 2024-07-14 DIAGNOSIS — N5201 Erectile dysfunction due to arterial insufficiency: Secondary | ICD-10-CM

## 2024-11-15 ENCOUNTER — Other Ambulatory Visit: Payer: Self-pay

## 2024-11-15 DIAGNOSIS — D751 Secondary polycythemia: Secondary | ICD-10-CM

## 2024-11-18 ENCOUNTER — Inpatient Hospital Stay

## 2024-11-18 ENCOUNTER — Inpatient Hospital Stay: Admitting: Oncology
